# Patient Record
Sex: Male | Born: 1955 | Race: White | Hispanic: No | Marital: Married | Smoking: Former smoker
Health system: Southern US, Community
[De-identification: ages and names within clinical notes are randomized; demographics above are authoritative.]

## PROBLEM LIST (undated history)

## (undated) DIAGNOSIS — I10 Essential (primary) hypertension: Secondary | ICD-10-CM

## (undated) DIAGNOSIS — I480 Paroxysmal atrial fibrillation: Secondary | ICD-10-CM

## (undated) DIAGNOSIS — I639 Cerebral infarction, unspecified: Secondary | ICD-10-CM

## (undated) DIAGNOSIS — N189 Chronic kidney disease, unspecified: Secondary | ICD-10-CM

## (undated) DIAGNOSIS — I2510 Atherosclerotic heart disease of native coronary artery without angina pectoris: Secondary | ICD-10-CM

---

## 2005-12-04 ENCOUNTER — Observation Stay: Payer: Self-pay | Admitting: Cardiology

## 2007-07-02 ENCOUNTER — Ambulatory Visit: Payer: Self-pay | Admitting: Family Medicine

## 2014-02-08 ENCOUNTER — Inpatient Hospital Stay: Payer: Self-pay | Admitting: Internal Medicine

## 2014-02-08 LAB — CBC WITH DIFFERENTIAL/PLATELET
BASOS ABS: 0 10*3/uL (ref 0.0–0.1)
BASOS PCT: 0.6 %
Eosinophil #: 0.1 10*3/uL (ref 0.0–0.7)
Eosinophil %: 2.1 %
HCT: 47.3 % (ref 40.0–52.0)
HGB: 15.3 g/dL (ref 13.0–18.0)
LYMPHS ABS: 0.9 10*3/uL — AB (ref 1.0–3.6)
Lymphocyte %: 14.6 %
MCH: 30.7 pg (ref 26.0–34.0)
MCHC: 32.3 g/dL (ref 32.0–36.0)
MCV: 95 fL (ref 80–100)
Monocyte #: 0.5 x10 3/mm (ref 0.2–1.0)
Monocyte %: 9.2 %
NEUTROS PCT: 73.5 %
Neutrophil #: 4.3 10*3/uL (ref 1.4–6.5)
PLATELETS: 216 10*3/uL (ref 150–440)
RBC: 4.98 10*6/uL (ref 4.40–5.90)
RDW: 13.3 % (ref 11.5–14.5)
WBC: 5.8 10*3/uL (ref 3.8–10.6)

## 2014-02-08 LAB — CK TOTAL AND CKMB (NOT AT ARMC)
CK, TOTAL: 190 U/L
CK-MB: 6 ng/mL — ABNORMAL HIGH (ref 0.5–3.6)

## 2014-02-08 LAB — COMPREHENSIVE METABOLIC PANEL
Albumin: 3.7 g/dL (ref 3.4–5.0)
Alkaline Phosphatase: 71 U/L
Anion Gap: 4 — ABNORMAL LOW (ref 7–16)
BILIRUBIN TOTAL: 0.5 mg/dL (ref 0.2–1.0)
BUN: 20 mg/dL — ABNORMAL HIGH (ref 7–18)
CREATININE: 1.48 mg/dL — AB (ref 0.60–1.30)
Calcium, Total: 8.3 mg/dL — ABNORMAL LOW (ref 8.5–10.1)
Chloride: 109 mmol/L — ABNORMAL HIGH (ref 98–107)
Co2: 28 mmol/L (ref 21–32)
EGFR (African American): >60
EGFR (Non-African Amer.): 52 — ABNORMAL LOW
Glucose: 97 mg/dL (ref 65–99)
OSMOLALITY: 284 (ref 275–301)
Potassium: 3.7 mmol/L (ref 3.5–5.1)
SGOT(AST): 31 U/L (ref 15–37)
SGPT (ALT): 45 U/L
SODIUM: 141 mmol/L (ref 136–145)
TOTAL PROTEIN: 7 g/dL (ref 6.4–8.2)

## 2014-02-08 LAB — TROPONIN I
Troponin-I: <0.02 ng/mL
Troponin-I: <0.02 ng/mL

## 2014-02-08 LAB — PROTIME-INR
INR: 1
PROTHROMBIN TIME: 12.7 s (ref 11.5–14.7)

## 2014-02-08 LAB — TSH: Thyroid Stimulating Horm: 2.02 u[IU]/mL

## 2014-02-08 LAB — APTT: Activated PTT: 25.7 secs (ref 23.6–35.9)

## 2014-02-09 LAB — BASIC METABOLIC PANEL
Anion Gap: 5 — ABNORMAL LOW (ref 7–16)
BUN: 18 mg/dL (ref 7–18)
Calcium, Total: 8 mg/dL — ABNORMAL LOW (ref 8.5–10.1)
Chloride: 110 mmol/L — ABNORMAL HIGH (ref 98–107)
Co2: 27 mmol/L (ref 21–32)
Creatinine: 1.31 mg/dL — ABNORMAL HIGH (ref 0.60–1.30)
EGFR (Non-African Amer.): 60 — ABNORMAL LOW
GLUCOSE: 90 mg/dL (ref 65–99)
OSMOLALITY: 285 (ref 275–301)
Potassium: 4.3 mmol/L (ref 3.5–5.1)
SODIUM: 142 mmol/L (ref 136–145)

## 2014-02-09 LAB — CBC WITH DIFFERENTIAL/PLATELET
BASOS PCT: 0.6 %
Basophil #: 0 10*3/uL (ref 0.0–0.1)
Eosinophil #: 0.1 10*3/uL (ref 0.0–0.7)
Eosinophil %: 2.3 %
HCT: 44.5 % (ref 40.0–52.0)
HGB: 14.6 g/dL (ref 13.0–18.0)
Lymphocyte #: 0.9 10*3/uL — ABNORMAL LOW (ref 1.0–3.6)
Lymphocyte %: 16 %
MCH: 31.2 pg (ref 26.0–34.0)
MCHC: 32.9 g/dL (ref 32.0–36.0)
MCV: 95 fL (ref 80–100)
MONO ABS: 0.7 x10 3/mm (ref 0.2–1.0)
Monocyte %: 12.6 %
NEUTROS PCT: 68.5 %
Neutrophil #: 3.7 10*3/uL (ref 1.4–6.5)
Platelet: 199 10*3/uL (ref 150–440)
RBC: 4.69 10*6/uL (ref 4.40–5.90)
RDW: 13.3 % (ref 11.5–14.5)
WBC: 5.4 10*3/uL (ref 3.8–10.6)

## 2014-02-09 LAB — CK TOTAL AND CKMB (NOT AT ARMC)
CK, Total: 194 U/L
CK-MB: 5.8 ng/mL — AB (ref 0.5–3.6)

## 2014-02-09 LAB — TROPONIN I

## 2014-02-10 DIAGNOSIS — I4891 Unspecified atrial fibrillation: Secondary | ICD-10-CM

## 2014-02-10 LAB — CBC WITH DIFFERENTIAL/PLATELET
BASOS ABS: 0 10*3/uL (ref 0.0–0.1)
BASOS PCT: 0.8 %
EOS ABS: 0.1 10*3/uL (ref 0.0–0.7)
EOS PCT: 2.1 %
HCT: 40.6 % (ref 40.0–52.0)
HGB: 12.8 g/dL — ABNORMAL LOW (ref 13.0–18.0)
Lymphocyte #: 1 10*3/uL (ref 1.0–3.6)
Lymphocyte %: 18.7 %
MCH: 30.4 pg (ref 26.0–34.0)
MCHC: 31.6 g/dL — AB (ref 32.0–36.0)
MCV: 96 fL (ref 80–100)
Monocyte #: 0.6 x10 3/mm (ref 0.2–1.0)
Monocyte %: 12.2 %
Neutrophil #: 3.5 10*3/uL (ref 1.4–6.5)
Neutrophil %: 66.2 %
Platelet: 174 10*3/uL (ref 150–440)
RBC: 4.22 10*6/uL — ABNORMAL LOW (ref 4.40–5.90)
RDW: 13.3 % (ref 11.5–14.5)
WBC: 5.3 10*3/uL (ref 3.8–10.6)

## 2014-02-10 LAB — BASIC METABOLIC PANEL
Anion Gap: 4 — ABNORMAL LOW (ref 7–16)
BUN: 16 mg/dL (ref 7–18)
CALCIUM: 8.2 mg/dL — AB (ref 8.5–10.1)
CHLORIDE: 107 mmol/L (ref 98–107)
CO2: 28 mmol/L (ref 21–32)
Creatinine: 1.19 mg/dL (ref 0.60–1.30)
EGFR (African American): >60
EGFR (Non-African Amer.): >60
GLUCOSE: 83 mg/dL (ref 65–99)
Osmolality: 278 (ref 275–301)
POTASSIUM: 4.3 mmol/L (ref 3.5–5.1)
SODIUM: 139 mmol/L (ref 136–145)

## 2014-02-10 LAB — CK-MB: CK-MB: 2.3 ng/mL (ref 0.5–3.6)
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
## 2014-08-22 NOTE — Consult Note (Signed)
Chief Complaint:  Subjective/Chief Complaint States to feel reasonably well no further episodes of weakness or numbness no palpitations or tachycardia. the patient feels like she is totally back to normal   VITAL SIGNS/ANCILLARY NOTES: **Vital Signs.:   13-Oct-15 11:48  Vital Signs Type Routine  Temperature Temperature (F) 97.9  Celsius 36.6  Temperature Source oral  Pulse Pulse 59  Respirations Respirations 18  Systolic BP Systolic BP 627  Diastolic BP (mmHg) Diastolic BP (mmHg) 71  Mean BP 85  Pulse Ox % Pulse Ox % 95  Pulse Ox Activity Level  At rest  Oxygen Delivery Room Air/ 21 %  *Intake and Output.:   13-Oct-15 11:15  Oral Intake      In:  240  Percentage of Meal Eaten  100   Brief Assessment:  GEN well developed, well nourished, no acute distress   Cardiac Regular  no murmur   Respiratory normal resp effort  clear BS   Gastrointestinal Normal   Gastrointestinal details normal Soft  Nontender   EXTR negative cyanosis/clubbing, negative edema   Lab Results: Thyroid:  11-Oct-15 15:34   Thyroid Stimulating Hormone 2.02 (0.45-4.50 (IU = International Unit)  ----------------------- Pregnant patients have  different reference  ranges for TSH:  - - - - - - - - - -  Pregnant, first trimetser:  0.36 - 2.50 uIU/mL)  LabObservation:  12-Oct-15 09:00   OBSERVATION Reason for Test  Hepatic:  11-Oct-15 15:34   Bilirubin, Total 0.5  Alkaline Phosphatase 71 (46-116 NOTE: New Reference Range 11/18/13)  SGPT (ALT) 45 (14-63 NOTE: New Reference Range 11/18/13)  SGOT (AST) 31  Total Protein, Serum 7.0  Albumin, Serum 3.7  Cardiology:  12-Oct-15 09:00   Echo Doppler REASON FOR EXAM:     COMMENTS:     PROCEDURE: [facility] - ECHO DOPPLER COMPLETE(TRANSTHOR)  - Feb 09 2014  9:00AM   RESULT: Echocardiogram Report  Patient Name:   Eric Bryant Date of Exam: 02/09/2014 Medical Rec #:  [id number]         Custom1: Dateof Birth:  15-Mar-1956      Height:       72.0  in Patient Age:    59 years       Weight:       179.0 lb Patient Gender: M              BSA:          2.03 m??  Indications: TIA Sonographer:    Sherrie Sport RDCS Referring Phys: Fulton Reek, D  Summary:  1. Left ventricular ejection fraction, by visual estimation, is 65 to  70%.  2. Normal global left ventricular systolic function.  3. Mild mitral valve regurgitation. 2D AND M-MODE MEASUREMENTS (normal ranges within parentheses): Left Ventricle:          Normal IVSd (2D):      1.35 cm (0.7-1.1) LVPWd (2D):     1.02 cm (0.7-1.1) Aorta/LA:                  Normal LVIDd (2D):     4.22 cm (3.4-5.7) Aortic Root (2D): 2.70 cm (2.4-3.7) LVIDs (2D):     2.59 cm           Left Atrium (2D): 3.80 cm (1.9-4.0) LV FS (2D):     38.6 %   (>25%) LV EF (2D):     69.3 %   (>50%)  Right Ventricle:                                   RVd (2D):        3.41 cm LV DIASTOLIC FUNCTION: MV Peak E: 0.89 m/s E/e' Ratio: 8.20 MV Peak A: 0.57 m/s Decel Time: 153 msec E/A Ratio: 1.56 SPECTRAL DOPPLER ANALYSIS (where applicable): Mitral Valve: MV P1/2 Time: 44.37 msec MV Area, PHT: 4.96 cm?? Aortic Valve: AoV Max Vel: 1.02 m/s AoV Peak PG: 4.2 mmHg AoV Mean PG: LVOT Vmax: 0.65 m/s LVOT VTI:  LVOT Diameter: 2.00 cm AoV Area, Vmax: 2.01 cm?? AoV Area, VTI:  AoV Area, Vmn: Tricuspid Valve and PA/RV Systolic Pressure: TR Max Velocity: 1.58 m/s RA  Pressure: 5 mmHg RVSP/PASP: 14.9 mmHg Pulmonic Valve: PV Max Velocity: 0.71 m/s PV Max PG: 2.0 mmHg PV Mean PG:  PHYSICIAN INTERPRETATION: Left Ventricle: The left ventricular internal cavity size was normal. LV  septal wall thickness was normal. LV posterior wall thickness was normal.  Global LV systolic function was normal. Left ventricular ejection  fraction, by visual estimation, is 65 to 70%. Right Ventricle: The right ventricular size is normal. Global RV systolic  function is normal. Left Atrium: The left atrium is normal  in size. Right Atrium: The right atrium is normal in size. Pericardium: There is no evidence of pericardial effusion. Mitral Valve: The mitral valve is normal in structure. Mild mitral valve  regurgitation is seen. Tricuspid Valve: The tricuspid valve is normal.Trivial tricuspid  regurgitation is visualized. The tricuspid regurgitant velocity is 1.58  m/s, and with an assumed right atrial pressure of 5 mmHg, the estimated  right ventricular systolic pressure is normal at 14.9 mmHg. Aortic Valve: The aortic valve is normal. No evidence of aortic valve  regurgitation is seen. Pulmonic Valve: The pulmonic valve is normal.  [place] MD Electronically signed by Jenkins MD Signature Date/Time: 02/09/2014/12:05:58 PM  *** Final ***  IMPRESSION: .  Verified By: Yolonda Kida, M.D., MD  Routine Chem:  11-Oct-15 15:34   Glucose, Serum 97  BUN  20  Creatinine (comp)  1.48  Sodium, Serum 141  Potassium, Serum 3.7  Chloride, Serum  109  CO2, Serum 28  Calcium (Total), Serum  8.3  Anion Gap  4  Osmolality (calc) 284  eGFR (African American) >60  eGFR (Non-African American)  52 (eGFR values <28m/min/1.73 m2 may be an indication of chronic kidney disease (CKD). Calculated eGFR, using the MRDR Study equation, is useful in  patients with stable renal function. The eGFR calculation will not be reliable in acutely ill patients when serum creatinine is changing rapidly. It is not useful in patients on dialysis. The eGFR calculation may not be applicable to patients at the low and high extremes of body sizes, pregnant women, and vetetarians.)  12-Oct-15 05:48   Glucose, Serum 90  BUN 18  Creatinine (comp)  1.31  Sodium, Serum 142  Potassium, Serum 4.3  Chloride, Serum  110  CO2, Serum 27  Calcium (Total), Serum  8.0  Anion Gap  5  Osmolality (calc) 285  eGFR (African American) >60  eGFR (Non-African American)  60 (eGFR values <683mmin/1.73 m2 may be  an indication of chronic kidney disease (CKD). Calculated eGFR, using the MRDR Study equation, is useful in  patients with stable renal function. The eGFR calculation will not be reliable in acutely ill patients when serum creatinine  is changing rapidly. It is not useful in patients on dialysis. The eGFR calculation may not be applicable to patients at the low and high extremes of body sizes, pregnant women, and vetetarians.)  Cardiac:  11-Oct-15 15:34   Troponin I < 0.02 (0.00-0.05 0.05 ng/mL or less: NEGATIVE  Repeat testing in 3-6 hrs  if clinically indicated. >0.05 ng/mL: POTENTIAL  MYOCARDIAL INJURY. Repeat  testing in 3-6 hrs if  clinically indicated. NOTE: An increase or decrease  of 30% or more on serial  testing suggests a  clinically important change)  Routine Coag:  11-Oct-15 15:34   Prothrombin 12.7  INR 1.0 (INR reference interval applies to patients on anticoagulant therapy. A single INR therapeutic range for coumarins is not optimal for all indications; however, the suggested range for most indications is 2.0 - 3.0. Exceptions to the INR Reference Range may include: Prosthetic heart valves, acute myocardial infarction, prevention of myocardial infarction, and combinations of aspirin and anticoagulant. The need for a higher or lower target INR must be assessed individually. Reference: The Pharmacology and Management of the Vitamin K  antagonists: the seventh ACCP Conference on Antithrombotic and Thrombolytic Therapy. WVPXT.0626 Sept:126 (3suppl): [id number]. A HCT value >55% may artifactually increase the PT.  In one study,  the increase was an average of 25%. Reference:  "Effect on Routine and Special Coagulation Testing Values of Citrate Anticoagulant Adjustment in Patients with High HCT Values." American Journal of Clinical Pathology 2006;126:400-405.)  Activated PTT (APTT) 25.7 (A HCT value >55% may artifactually increase the APTT. In one study, the  increase was an average of 19%. Reference: "Effect on Routine and Special Coagulation Testing Values of Citrate Anticoagulant Adjustment in Patients with High HCT Values." American Journal of Clinical Pathology 2006;126:400-405.)  Routine Hem:  11-Oct-15 15:34   WBC (CBC) 5.8  RBC (CBC) 4.98  Hemoglobin (CBC) 15.3  Hematocrit (CBC) 47.3  Platelet Count (CBC) 216  MCV 95  MCH 30.7  MCHC 32.3  RDW 13.3  Neutrophil % 73.5  Lymphocyte % 14.6  Monocyte % 9.2  Eosinophil % 2.1  Basophil % 0.6  Neutrophil # 4.3  Lymphocyte #  0.9  Monocyte # 0.5  Eosinophil # 0.1  Basophil # 0.0 (Result(s) reported on 08 Feb 2014 at 03:47PM.)  12-Oct-15 05:48   WBC (CBC) 5.4  RBC (CBC) 4.69  Hemoglobin (CBC) 14.6  Hematocrit (CBC) 44.5  Platelet Count (CBC) 199  MCV 95  MCH 31.2  MCHC 32.9  RDW 13.3  Neutrophil % 68.5  Lymphocyte % 16.0  Monocyte % 12.6  Eosinophil % 2.3  Basophil % 0.6  Neutrophil # 3.7  Lymphocyte #  0.9  Monocyte # 0.7  Eosinophil # 0.1  Basophil # 0.0 (Result(s) reported on 09 Feb 2014 at 06:17AM.)   Radiology Results: [place]:    11-Oct-15 17:53, US Carotid Doppler Bilateral  US Carotid Doppler Bilateral   REASON FOR EXAM:    TIA  COMMENTS:       PROCEDURE: [place]  - US CAROTID DOPPLER BILATERAL  - Feb 08 2014  5:53PM     CLINICAL DATA:  TIA, syncopal episode, visual disturbance. Initial  encounter.    EXAM:  BILATERAL CAROTID DUPLEX ULTRASOUND    TECHNIQUE:  Pearline Cables scale imaging, color Doppler and duplex ultrasound were  performed of bilateral carotid and vertebral arteries in the neck.  COMPARISON:  Head CT -02/08/2014    FINDINGS:  Criteria: Quantification of carotid stenosis is based on velocity  parameters that correlate the  residual internal carotid diameter  with NASCET-based stenosis levels, using the diameter of the distal  internal carotid lumen as the denominator for stenosis measurement.    The following velocity measurements were  obtained:    RIGHT    ICA:  91/37.9 cm/sec    CCA:  29.9/37.1 cm/sec  SYSTOLIC ICA/CCA RATIO:  6.96    DIASTOLIC ICA/CCA RATIO:  1.6    ECA:  90.4 cm/sec    LEFT    ICA:  97.2/44.5 cm/sec    CCA:  789.3/81.0 cm/sec    SYSTOLIC ICA/CCA RATIO:  1.75    DIASTOLIC ICA/CCA RATIO:  1.6  ECA:  119.3 cm/sec    RIGHT CAROTID ARTERY: There is a minimal amount of eccentric mixed  echogenic plaque involving the origin and proximal aspects of the  right internal carotid artery (image 17), not resulting in elevated  peak systolic velocities within the interrogated course to the right  internal carotid artery to suggest a hemodynamically significant  stenosis.    RIGHT VERTEBRAL ARTERY:  Antegrade flow    LEFT CAROTID ARTERY: There is a minimal amount of eccentric mixed  echogenic plaque within the left carotid bulb (images 48 and 62),  not resulting in elevated peak systolic velocities within the  interrogated course of the left internal carotid artery to suggest a  hemodynamically significant stenosis.    LEFT VERTEBRAL ARTERY:  Antegrade flow     IMPRESSION:  Minimal amount of bilateral atherosclerotic plaque, right  subjectively greater than left, not resulting in a hemodynamically  significant stenosis.      Electronically Signed    By: Sandi Mariscal M.D.    On: 02/08/2014 18:10       Verified By: Aileen Fass, M.D.,  MRI:    12-Oct-15 09:06, MRI Brain Without Contrast  MRI Brain Without Contrast   REASON FOR EXAM:    TIA  COMMENTS:       PROCEDURE: MR  - MR BRAIN WO CONTRAST  - Feb 09 2014  9:06AM     CLINICAL DATA:  Initial encounter for 5 min episode yesterday which  includes slurred speech, numbness to the right side of the face,  presyncopal sensation, and ringing in the ears.    EXAM:  MRI HEAD WITHOUT CONTRAST    TECHNIQUE:  Multiplanar, multiecho pulse sequences of the brain and surrounding  structures were obtained without intravenous  contrast.  COMPARISON:  CT head without contrast 02/08/2014.    FINDINGS:  A 4 mm focus of restricted diffusion is evident within the posterior  left frontal lobe, potentially presumably along the primary motor  cortex. There is associated T2 signal change. No hemorrhage is  evident.    No other focal areas of restricted diffusion is present. There is no  significant white matter disease or evidence for prior ischemia.    The ventricles are of normal size. No significant extra-axial fluid  collection is present.    Flow is present in the majorintracranial arteries. The globes and  orbits are intact. Mild mucosal thickening is present in the  maxillary sinuses and ethmoid air cells bilaterally. The mastoid air  cells are clear.     IMPRESSION:  1. 4 mm area of acute/subacute nonhemorrhagicinfarction involving  the posterior left frontal lobe, presumably along the primary motor  cortex.  2. No other acute infarct or evidence for significant ischemia,  acute or chronic.  3. Mild diffuse sinus disease.      Electronically Signed  By: Lawrence Santiago M.D.    On: 02/09/2014 09:18         Verified By: Resa Miner. MATTERN, M.D.,  Cardiology:    11-Oct-15 15:29, ED ECG  Ventricular Rate 129  Atrial Rate 115  QRS Duration 92  QT 310  QTc 454  R Axis 56  T Axis 66  ECG interpretation   Atrial fibrillation with rapid ventricular response  Abnormal ECG  No previous ECGs available  ----------unconfirmed----------  Confirmed by OVERREAD, NOT (100), editor PEARSON, BARBARA (32) on 02/09/2014 12:45:18 PM  ED ECG     11-Oct-15 16:44, ED ECG  Ventricular Rate 71  Atrial Rate 71  P-R Interval 154  QRS Duration 92  QT 368  QTc 399  P Axis 63  R Axis 36  T Axis 49  ECG interpretation   Sinus rhythm with Premature supraventricular complexes  Left atrial enlargement  Borderline ECG  No previous ECGs available  ----------unconfirmed----------  Confirmed by OVERREAD, NOT  (100), editor PEARSON, BARBARA (32) on 02/09/2014 12:45:32 PM  ED ECG     12-Oct-15 09:00, Echo Doppler  Echo Doppler   REASON FOR EXAM:      COMMENTS:       PROCEDURE: [place] - ECHO DOPPLER COMPLETE(TRANSTHOR)  - Feb 09 2014  9:00AM     RESULT: Echocardiogram Report    Patient Name:   Eric Bryant Date of Exam: 02/09/2014  Medical Rec #:  [id number]         Custom1:  Dateof Birth:  13-Nov-1955      Height:       72.0 in  Patient Age:    59 years       Weight:       179.0 lb  Patient Gender: M              BSA:          2.03 m??    Indications: TIA  Sonographer:    Sherrie Sport RDCS  Referring Phys: Fulton Reek, D    Summary:   1. Left ventricular ejection fraction, by visual estimation, is 65 to   70%.   2. Normal global left ventricular systolic function.   3. Mild mitral valve regurgitation.  2D AND M-MODE MEASUREMENTS (normal ranges within parentheses):  Left Ventricle:          Normal  IVSd (2D):      1.35 cm (0.7-1.1)  LVPWd (2D):     1.02 cm (0.7-1.1) Aorta/LA:                  Normal  LVIDd (2D):     4.22 cm (3.4-5.7) Aortic Root (2D): 2.70 cm (2.4-3.7)  LVIDs (2D):     2.59 cm           Left Atrium (2D): 3.80 cm (1.9-4.0)  LV FS (2D):     38.6 %   (>25%)  LV EF (2D):     69.3 %   (>50%)                                    Right Ventricle:                                    RVd (2D):        2.46 cm  LV  DIASTOLIC FUNCTION:  MV Peak E: 0.89 m/s E/e' Ratio: 8.20  MV Peak A: 0.57 m/s Decel Time: 153 msec  E/A Ratio: 1.56  SPECTRAL DOPPLER ANALYSIS (where applicable):  Mitral Valve:  MV P1/2 Time: 44.37 msec  MV Area, PHT: 4.96 cm??  Aortic Valve: AoV Max Vel: 1.02 m/s AoV Peak PG: 4.2 mmHg AoV Mean PG:  LVOT Vmax: 0.65 m/s LVOT VTI:  LVOT Diameter: 2.00 cm  AoV Area, Vmax: 2.01 cm?? AoV Area, VTI:  AoV Area, Vmn:  Tricuspid Valve and PA/RV Systolic Pressure: TR Max Velocity: 1.58 m/s RA   Pressure: 5 mmHg RVSP/PASP: 14.9 mmHg  Pulmonic Valve:  PV Max Velocity: 0.71 m/s  PV Max PG: 2.0 mmHg PV Mean PG:    PHYSICIAN INTERPRETATION:  Left Ventricle: The left ventricular internal cavity size was normal. LV   septal wall thickness was normal. LV posterior wall thickness was normal.   Global LV systolic function was normal. Left ventricular ejection   fraction, by visual estimation, is 65 to 70%.  Right Ventricle: The right ventricular size is normal. Global RV systolic   function is normal.  Left Atrium: The left atrium is normal in size.  Right Atrium: The right atrium is normal in size.  Pericardium: There is no evidence of pericardial effusion.  Mitral Valve: The mitral valve is normal in structure. Mild mitral valve   regurgitation is seen.  Tricuspid Valve: The tricuspid valve is normal.Trivial tricuspid   regurgitation is visualized. The tricuspid regurgitant velocity is 1.58   m/s, and with an assumed right atrial pressure of 5 mmHg, the estimated   right ventricular systolic pressure is normal at 14.9 mmHg.  Aortic Valve: The aortic valve is normal. No evidence of aortic valve   regurgitation is seen.  Pulmonic Valve: The pulmonic valve is normal.    [place] MD  Electronically signed by [place] MD  Signature Date/Time: 02/09/2014/12:05:58 PM    *** Final ***    IMPRESSION: .    Verified By: Yolonda Kida, M.D., MD  CT:    11-Oct-15 15:52, CT Head Without Contrast  CT Head Without Contrast   REASON FOR EXAM:    Slurred speech, facial numbness  COMMENTS:       PROCEDURE: CT  - CT HEAD WITHOUT CONTRAST  - Feb 08 2014  3:52PM     CLINICAL DATA:  Slurred speech, right face numbness for 1 hr, right  face tingling    EXAM:  CT HEAD WITHOUT CONTRAST    TECHNIQUE:  Contiguous axial images were obtained from the base of the skull  through the vertex without intravenous contrast.  COMPARISON:  None.    FINDINGS:  No skull fracture is noted. Paranasal sinuses and mastoid air cells  are unremarkable. No  intracranial hemorrhage, mass effect or midline  shift.    No acute cortical infarction. No mass lesion is noted on this  unenhanced scan. No hydrocephalus.     IMPRESSION:  No acute intracranial abnormality. No definite acute cortical  infarction.    Electronically Signed    By: Lahoma Crocker M.D.    On: 02/08/2014 15:57         Verified By: Ephraim Hamburger, M.D.,   Assessment/Plan:  Assessment/Plan:  Assessment IMP  CVA  TIA  atrial fibrillation  chronic renal insufficiency  hypertension  possible obstructive sleep apnea .   Plan PLAN  continue telemetry today  continue aspirin 81 mg a day  will start anticoagulation with Eliquis 5 mg twice a day for AFib  discontinue  metoprolol  start sotalol 80 mg twice a day 1st dose was last night      will try given additional 2 with 3 dosages today  blood pressure control diet exercise and hopefully with low but of sotalol  follow-up renal insufficiency consider evaluation by Nephrology  recommend obstructive sleep apnea evaluation including sleep study and or CPAP  hopefully patient maybe baby discharged today after watching him on the monitor   check EKG today prior to discharge for evaluation of QT  patient should follow-up with Dr. Ubaldo Glassing within 1 week   Electronic Signatures: Lujean Amel D (MD)  (Signed 13-Oct-15 15:18)  Authored: Chief Complaint, VITAL SIGNS/ANCILLARY NOTES, Brief Assessment, Lab Results, Radiology Results, Assessment/Plan   Last Updated: 13-Oct-15 15:18 by Yolonda Kida (MD)

## 2014-08-22 NOTE — Discharge Summary (Signed)
PATIENT NAME:  Eric Bryant, Eric Bryant MR#:  [id number] DATE OF BIRTH:  October 03, 1955  DATE OF ADMISSION:  02/08/2014 DATE OF DISCHARGE:  02/10/2014  DISCHARGE DIAGNOSES:  1.  Cerebrovascular accident, 4 mm nonhemorrhagic infarction involving posterior left frontal lobe along the primary motor cortex.  2.  Paroxysmal atrial fibrillation, now on Eliquis.  3.  Hypertension.  4.  Acute kidney injury, resolved.   CONSULTATIONS: Lujean Amel, MD, cardiology.   PROCEDURES:  1.  CT of the head without contrast October 11 shows no acute intracranial abnormality. No definite acute cortical infarction.  2.  Bilateral carotid Dopplers show minimal amount of bilateral atherosclerotic plaque, right subjectively greater than left, not resulting in hemodynamically significant stenoses.  3.  MRI of the brain without contrast shows a 4 mm area of acute/subacute nonhemorrhagic infarction involving the posterior left frontal lobe, presumably along the primary motor cortex. No other acute infarct or evidence for significant ischemia. Mild diffuse sinus disease.  4.  A 2D echocardiogram October 12 shows left ventricular ejection fraction is 65% to 70%. Normal global left ventricular systolic function. Mild mitral valve regurgitation.   HISTORY OF PRESENT ILLNESS: This very pleasant 59 year old male with history of transient atrial fibrillation, on beta blocker therapy, followed by Dr. Ubaldo Glassing presents with abrupt onset of right-sided facial weakness. The patient reports that he was driving when he noted a sensation of numbness over the right side of the face with blurred vision in the right eye. He also noticed an expressive aphasia when he turned his wife to tell her something was wrong. Symptoms resolved completely after 15 minutes. He was brought to the Emergency Room for evaluation and was noted to be in rapid atrial fibrillation, which spontaneously converted to normal sinus rhythm.   HOSPITAL COURSE BY PROBLEM:  1.   Cerebrovascular accident: Symptoms resolved completely within 15 minutes with no residual defect. MRI does show new 4 mm nonhemorrhagic infarct. Two-D echocardiogram and carotid Dopplers are normal. He was started on Eliquis and aspirin for secondary prevention of stroke.  2.  Atrial fibrillation: He was started on sotalol for rate control as well as Eliquis during this hospitalization. He was seen by Dr. Marcelline Deist. He was discharged after receiving 3 doses of sotalol and followup EKG was normal.  3.  Hypertension: Metoprolol was discontinued. Blood pressure was controlled on sotalol.  4.  Acute kidney injury: Resolved with hydration.   DISCHARGE PHYSICAL EXAMINATION:  VITAL SIGNS: Temperature 97.8, pulse 56, respirations 18, blood pressure 117/71, oxygenation 97% on room air.  GENERAL: No acute distress.  CARDIOVASCULAR: Regular rate and rhythm; no murmurs, rubs or gallops; no peripheral edema. Peripheral pulses are 2+.  RESPIRATORY: Lungs are clear to auscultation bilaterally with good air movement.   LABORATORY DATA: Sodium 139, potassium 4.3, chloride 107, bicarb 28, BUN 16, creatinine 1.19. Cardiac enzymes negative throughout hospitalization with the exception of CK-MB which was initially 6.0, but decreased to 2.3. TSH 2.02. White blood cells 5.3, hemoglobin 12.8, platelets 174,000, MCV 96.   CONDITION ON DISCHARGE: Stable.   DISPOSITION: The patient is discharged to home.   DISCHARGE MEDICATIONS:  1.  Aspirin 81 mg 1 tablet daily.  2.  Centrum Silver Mens multivitamin 1 tablet once daily.  3.  Fish oil 360 mg orally once a day.  4.  Betapace 80 mg 1 tablet twice a day.  5.  Eliquis 5 mg 1 tablet 2 times a day.   DISCHARGE INSTRUCTIONS:  DIET: Low-fat, low-cholesterol diet.   ACTIVITY:  No restrictions.   FOLLOWUP:  1.  In 1 to 2 weeks with Dr. Ubaldo Glassing.  2.  Follow up in 1 to 2 weeks with primary care   TIME SPENT ON DISCHARGE: Forty-five  minutes.   ____________________________ Earleen Newport. Volanda Napoleon, MD cpw:TT D: 02/11/2014 16:32:50 ET T: 02/11/2014 20:51:10 ET JOB#: [id number]  cc: Barnetta Chapel P. Volanda Napoleon, MD, <Dictator> Aldean Jewett MD ELECTRONICALLY SIGNED 02/12/2014 15:06

## 2014-08-22 NOTE — H&P (Signed)
PATIENT NAME:  Eric Bryant, Eric Bryant MR#:  [id number] DATE OF BIRTH:  Feb 16, 1956  REFERRING PHYSICIAN: Elta Guadeloupe R. Jacqualine Code, MD  FAMILY PHYSICIAN: None.   REASON FOR ADMISSION: Transient ischemic attack.   HISTORY OF PRESENT ILLNESS: The patient is a 59 year old male with a history of transient atrial fibrillation on beta blocker therapy followed by Dr. Ubaldo Glassing. The patient denies any other significant past medical history. Today, the patient was driving when he developed right-sided facial weakness with blurred vision and numbness of the right face. Also noted to have expressive aphasia. Was brought to the Emergency Room where he was initially noted to be in rapid atrial fibrillation. He spontaneously converted to a normal sinus rhythm. His neurologic symptoms resolved after 15 minutes. He is now admitted for further evaluation.   PAST MEDICAL HISTORY: 1.  Paroxysmal atrial fibrillation.  2.  Benign hypertension.   MEDICATIONS:  Toprol-XL 25 mg p.o. daily.  ' ALLERGIES: PENICILLIN.   SOCIAL HISTORY: Negative for alcohol or tobacco use.   FAMILY HISTORY: Positive for coronary artery disease and hypertension.   REVIEW OF SYSTEMS:  CONSTITUTIONAL: No fever or change in weight.  EYES: Blurred vision as per HPI. No glaucoma.  EARS, NOSE AND THROAT: Did have some right-sided tinnitus. No difficulty swallowing. RESPIRATORY: No cough or wheezing. Denies hemoptysis.  CARDIOVASCULAR: No chest pain or palpitations. No syncope.  GASTROINTESTINAL: No nausea, vomiting, or diarrhea. No abdominal pain.  GENITOURINARY: No dysuria or hematuria. No incontinence.   ENDOCRINE: No polyuria or polydipsia. No heat or cold intolerance.  HEMATOLOGIC: The patient denies anemia, easy bruising, or bleeding.  LYMPHATIC: No swollen glands.  MUSCULOSKELETAL: The patient denies pain in his neck, back, shoulders, knees, or hips. No gout.  NEUROLOGIC: The patient did have numbness of the right face which has resolved. Denies  migraines, stroke, or seizures.  PSYCHIATRIC: The patient denies anxiety, insomnia, or depression.   PHYSICAL EXAMINATION: GENERAL: The patient is currently in no acute distress.  VITAL SIGNS: Remarkable for a blood pressure of 155/95 with a heart rate of 72, respiratory rate of 16, temperature of 98.1.  HEENT: Normocephalic, atraumatic. Pupils equally round and react to light and accommodation. Extraocular movements are intact. Sclerae not icteric. Conjunctivae are clear.  Oropharynx is clear. NECK: Supple without JVD or bruits. No adenopathy or thyromegaly is noted.  LUNGS: Clear to auscultation and percussion without wheezes, rales, or rhonchi. No dullness. Respiratory effort is normal.  CARDIAC: Regular rate and rhythm with normal S1, S2. No significant rubs, murmurs, or gallops. PMI is nondisplaced. Chest wall is nontender.  ABDOMEN: Soft, nontender, with normoactive bowel sounds. No organomegaly or masses were appreciated. No hernias or bruits were noted.  EXTREMITIES: Without clubbing, cyanosis, or edema. Pulses were 2+ bilaterally.  SKIN: Warm and dry without rash or lesions.  NEUROLOGIC: Revealed cranial nerves II through XII grossly intact. Deep tendon reflexes were symmetric. Motor and sensory exam is nonfocal.  PSYCHIATRIC: Revealed the patient was alert and oriented to person, place, and time. He was cooperative and used good judgment.   LABORATORY DATA: Initial EKG revealed rapid atrial fibrillation with no acute ischemic changes. Subsequent EKG revealed sinus rhythm with no acute ischemic changes. Head CT was unremarkable. His white count was 5.8 with a hemoglobin of 15.3. Glucose 97 with a BUN of 20, creatinine 1.48 and a GFR of 52. Troponin was less than 0.02. TSH was normal at 2.02.   ASSESSMENT: 1.  Transient ischemic attack manifested by expressive aphasia and facial numbness.  2.  Benign hypertension.  3.  Transient atrial fibrillation with rapid ventricular response,  resolved.  4.  Renal insufficiency.   PLAN: The patient will be observed on telemetry with aspirin and Lovenox. Will begin Lopressor 25 mg p.o. q. 6 hours. We will follow serial cardiac enzymes and obtain an echocardiogram. We will also consult cardiology. Because of his expressive aphasia and facial numbness, we will obtain an MRI of the brain and carotid Dopplers. Follow up routine labs in the morning. Neurologic checks q. 4 hours. Further treatment and evaluation will depend upon the patient's progress.   Total time spent on this patient was 45 minutes.    ____________________________ Leonie Douglas Doy Hutching, MD jds:LT D: 02/08/2014 17:21:00 ET T: 02/08/2014 20:25:48 ET JOB#: [id number]  cc: Leonie Douglas. Doy Hutching, MD, <Dictator> Walter Min Lennice Sites MD ELECTRONICALLY SIGNED 02/09/2014 8:01

## 2014-08-22 NOTE — Consult Note (Signed)
Brief Consult Note: Diagnosis: CVA/AFIB.   Patient was seen by consultant.   Consult note dictated.   Recommend further assessment or treatment.   Orders entered.   Discussed with Attending MD.   Comments: IMP CVA AFIB paraxysmal HTN OSA? Marland Kitchen PLAN Tele ASA 81 daily Start sotolol 80 mg po bid D/C metoprolol for now Anticoug long term with Eliquis 5mg  bid Sleep study for eval of OSA Lipids studies for possible ascvd OK to d/c in 24-48 hr after 3 doses of sotolol on tele F/U with Fath 1-2 weeks RTW 1 week.  Electronic Signatures: Lujean Amel D (MD)  (Signed 13-Oct-15 07:08)  Authored: Brief Consult Note   Last Updated: 13-Oct-15 07:08 by Yolonda Kida (MD)

## 2016-06-21 DIAGNOSIS — I10 Essential (primary) hypertension: Secondary | ICD-10-CM | POA: Diagnosis not present

## 2016-06-21 DIAGNOSIS — Z0000 Encounter for general adult medical examination without abnormal findings: Secondary | ICD-10-CM | POA: Diagnosis not present

## 2016-06-21 DIAGNOSIS — Z8673 Personal history of transient ischemic attack (TIA), and cerebral infarction without residual deficits: Secondary | ICD-10-CM | POA: Diagnosis not present

## 2016-07-04 DIAGNOSIS — L570 Actinic keratosis: Secondary | ICD-10-CM | POA: Diagnosis not present

## 2016-07-04 DIAGNOSIS — D225 Melanocytic nevi of trunk: Secondary | ICD-10-CM | POA: Diagnosis not present

## 2016-07-24 DIAGNOSIS — I10 Essential (primary) hypertension: Secondary | ICD-10-CM | POA: Diagnosis not present

## 2016-07-24 DIAGNOSIS — I480 Paroxysmal atrial fibrillation: Secondary | ICD-10-CM | POA: Diagnosis not present

## 2016-07-25 DIAGNOSIS — M1711 Unilateral primary osteoarthritis, right knee: Secondary | ICD-10-CM | POA: Diagnosis not present

## 2017-07-02 DIAGNOSIS — I480 Paroxysmal atrial fibrillation: Secondary | ICD-10-CM | POA: Diagnosis not present

## 2017-07-02 DIAGNOSIS — I4891 Unspecified atrial fibrillation: Secondary | ICD-10-CM | POA: Diagnosis not present

## 2017-07-02 DIAGNOSIS — I10 Essential (primary) hypertension: Secondary | ICD-10-CM | POA: Diagnosis not present

## 2017-07-03 DIAGNOSIS — L570 Actinic keratosis: Secondary | ICD-10-CM | POA: Diagnosis not present

## 2017-07-03 DIAGNOSIS — D225 Melanocytic nevi of trunk: Secondary | ICD-10-CM | POA: Diagnosis not present

## 2018-06-04 DIAGNOSIS — D2261 Melanocytic nevi of right upper limb, including shoulder: Secondary | ICD-10-CM | POA: Diagnosis not present

## 2018-06-04 DIAGNOSIS — L570 Actinic keratosis: Secondary | ICD-10-CM | POA: Diagnosis not present

## 2018-06-04 DIAGNOSIS — D225 Melanocytic nevi of trunk: Secondary | ICD-10-CM | POA: Diagnosis not present

## 2018-06-04 DIAGNOSIS — D2262 Melanocytic nevi of left upper limb, including shoulder: Secondary | ICD-10-CM | POA: Diagnosis not present

## 2018-06-04 DIAGNOSIS — X32XXXA Exposure to sunlight, initial encounter: Secondary | ICD-10-CM | POA: Diagnosis not present

## 2018-07-04 DIAGNOSIS — I480 Paroxysmal atrial fibrillation: Secondary | ICD-10-CM | POA: Diagnosis not present

## 2018-07-04 DIAGNOSIS — Z8673 Personal history of transient ischemic attack (TIA), and cerebral infarction without residual deficits: Secondary | ICD-10-CM | POA: Diagnosis not present

## 2018-07-04 DIAGNOSIS — I10 Essential (primary) hypertension: Secondary | ICD-10-CM | POA: Diagnosis not present

## 2019-09-23 DIAGNOSIS — Z8673 Personal history of transient ischemic attack (TIA), and cerebral infarction without residual deficits: Secondary | ICD-10-CM | POA: Diagnosis not present

## 2019-09-23 DIAGNOSIS — Z125 Encounter for screening for malignant neoplasm of prostate: Secondary | ICD-10-CM | POA: Diagnosis not present

## 2019-09-23 DIAGNOSIS — I480 Paroxysmal atrial fibrillation: Secondary | ICD-10-CM | POA: Diagnosis not present

## 2019-09-23 DIAGNOSIS — I10 Essential (primary) hypertension: Secondary | ICD-10-CM | POA: Diagnosis not present

## 2019-09-30 DIAGNOSIS — K649 Unspecified hemorrhoids: Secondary | ICD-10-CM | POA: Diagnosis not present

## 2019-09-30 DIAGNOSIS — I10 Essential (primary) hypertension: Secondary | ICD-10-CM | POA: Diagnosis not present

## 2019-09-30 DIAGNOSIS — Z8673 Personal history of transient ischemic attack (TIA), and cerebral infarction without residual deficits: Secondary | ICD-10-CM | POA: Diagnosis not present

## 2019-09-30 DIAGNOSIS — I480 Paroxysmal atrial fibrillation: Secondary | ICD-10-CM | POA: Diagnosis not present

## 2020-03-31 DIAGNOSIS — I10 Essential (primary) hypertension: Secondary | ICD-10-CM | POA: Diagnosis not present

## 2020-03-31 DIAGNOSIS — R079 Chest pain, unspecified: Secondary | ICD-10-CM | POA: Diagnosis not present

## 2020-03-31 DIAGNOSIS — Z8673 Personal history of transient ischemic attack (TIA), and cerebral infarction without residual deficits: Secondary | ICD-10-CM | POA: Diagnosis not present

## 2020-03-31 DIAGNOSIS — I480 Paroxysmal atrial fibrillation: Secondary | ICD-10-CM | POA: Diagnosis not present

## 2020-04-02 DIAGNOSIS — R079 Chest pain, unspecified: Secondary | ICD-10-CM | POA: Diagnosis not present

## 2020-04-06 DIAGNOSIS — R9439 Abnormal result of other cardiovascular function study: Secondary | ICD-10-CM | POA: Diagnosis not present

## 2020-04-06 DIAGNOSIS — I480 Paroxysmal atrial fibrillation: Secondary | ICD-10-CM | POA: Diagnosis not present

## 2020-04-06 DIAGNOSIS — Z8673 Personal history of transient ischemic attack (TIA), and cerebral infarction without residual deficits: Secondary | ICD-10-CM | POA: Diagnosis not present

## 2020-04-06 DIAGNOSIS — I10 Essential (primary) hypertension: Secondary | ICD-10-CM | POA: Diagnosis not present

## 2020-04-07 ENCOUNTER — Telehealth: Payer: Self-pay

## 2020-04-07 DIAGNOSIS — I25118 Atherosclerotic heart disease of native coronary artery with other forms of angina pectoris: Secondary | ICD-10-CM

## 2020-04-07 NOTE — Addendum Note (Signed)
Addended by: Lamar Laundry on: 04/07/2020 04:39 PM   Modules accepted: Orders

## 2020-04-07 NOTE — Telephone Encounter (Addendum)
Spoke with the patients wife Eric Bryant. There were no medications or allergies listed in the patients chart. Updated the patients medication and allergy list as reported by the patients wife. Pt wife Eric Bryant given verbal pre-procdure instructions listed below.  Eric Bryant that the patient will f/u with his Eric Bryant Eric Bryant unless different instructions are given by Eric Bryant.  Eric Bryant verbalized understanding to the instructions given and has no additional questions or concerns at this time.  She does rqst a link for the pt to sign up for mychart be sent to [email or web address]. sent as requested.    [facility] [address] Eric Bryant [place] [address] Dept: [phone] Loc: [phone]  Eric Bryant  04/07/2020  You are scheduled for a Cardiac Catheterization on Wednesday, December 15 with Eric Bryant.  1. Please arrive at the [facility] (Main Entrance A) at [facility]: [address] [place], [place] [address] at 10:00 AM (This time is two hours before your procedure to ensure your preparation). Free valet parking service is available.   Special note: Every effort is made to have your procedure done on time. Please understand that emergencies sometimes delay scheduled procedures.  2. Diet: Do not eat solid foods after midnight.  The patient may have clear liquids until 5am upon the day of the procedure.  3. Labs: CMP and CBC were drwan at your pcps office on 04/06/20 and are viewable in care everywhere. You will need a COVID test on Mon 04/12/20. Please repot to the [facility] medical arts drive up test site.  4. Medication instructions in preparation for your procedure:   Contrast Allergy: No   Stop taking Eliquis (Apixiban) on Sunday, December 12.   HOLD OTC supplements.    On the morning of your procedure, take your Aspirin and any morning medicines NOT  listed above.  You may use sips of water.  5. Plan for one night stay--bring personal belongings. 6. Bring a current list of your medications and current insurance cards. 7. You MUST have a responsible person to drive you home. 8. Someone MUST be with you the first 24 hours after you arrive home or your discharge will be delayed. 9. Please wear clothes that are easy to get on and off and wear slip-on shoes.  Thank you for allowing [place] to care for you!   -- [facility]

## 2020-04-07 NOTE — Telephone Encounter (Signed)
Spoke with the patients. Patients cardiac cath is scheduled for 04/14/20 @ 12pm @ [employer] with Dr. Fletcher Anon.  Patient sts that he is currently at work and will need to call back to go over his pre-procedure instructions.

## 2020-04-07 NOTE — Telephone Encounter (Addendum)
Patient gave verbal permission to give the pre-procedure instructions with his wife Lenna Sciara.   Contacted the patients wife Melissa. There are no allergies or medications listed in the patients chart. Melissa sts that she is not currently home and will need to call back with that info.  Patient did have a cmp and cbc on 04/06/20 results are available in care everywhere.

## 2020-04-07 NOTE — Telephone Encounter (Signed)
-----   Message from Wellington Hampshire, MD sent at 04/06/2020  5:45 PM EST ----- This is a patient of Dr. Ubaldo Glassing with chest pain and abnormal stress test. Dr. Ubaldo Glassing requested I do left heart cath and possible PCI at [facility]. I can do next week on Wednesday . Please arrange. Thanks.

## 2020-04-12 ENCOUNTER — Telehealth: Payer: Self-pay | Admitting: *Deleted

## 2020-04-12 ENCOUNTER — Other Ambulatory Visit: Payer: Self-pay

## 2020-04-12 ENCOUNTER — Other Ambulatory Visit
Admission: RE | Admit: 2020-04-12 | Discharge: 2020-04-12 | Disposition: A | Discharge status: Home / Self Care | Payer: PPO | Source: Ambulatory Visit | Attending: Cardiovascular Disease | Admitting: Cardiovascular Disease

## 2020-04-12 DIAGNOSIS — Z20822 Contact with and (suspected) exposure to covid-19: Secondary | ICD-10-CM | POA: Diagnosis not present

## 2020-04-12 DIAGNOSIS — Z01812 Encounter for preprocedural laboratory examination: Secondary | ICD-10-CM | POA: Insufficient documentation

## 2020-04-12 NOTE — Telephone Encounter (Signed)
[facility]HC 04/14/20-pre-procedure COVID-19 test this morning. Call placed to patient to review procedure instructions.  I spoke with patient and he was at his work, states he was not told he should quarantine from time of COVID-19 test until reporting to hospital for procedure 04/14/20. 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 Pt states if he does need to repeat COVID-19 test in the morning, he would not be able to stay home to quarantine, but could quarantine in an office at his business. Pt reports he has not had any symptoms concerning for COVID-19. Pt reports he has not been vaccinated for COVID-19.  Pt advised I will forward to Dr Fletcher Anon for recommendation about pre-procedure COVID-19 test: -Need to repeat COVID-19 test since he has not quarantined since COVID-19 this morning -If he does repeat tomorrow, would not be able to quarantine at home, states he would quarantine at his business.

## 2020-04-12 NOTE — Telephone Encounter (Addendum)
Pt contacted pre-catheterization scheduled at [facility] for: Wednesday April 14, 2020 12 Noon Verified arrival time and place: [facility]) at: 10 AM   No solid food after midnight prior to cath, clear liquids until 5 AM day of procedure.  Hold: Eliquis-none 04/12/20 until post procedure  Except hold medications AM meds can be  taken pre-cath with sips of water including: ASA 81 mg   Confirmed patient has responsible adult to drive home post procedure and be with patient first 24 hours after arriving home: yes  You are allowed ONE visitor in the waiting room during the time you are at the hospital for your procedure. Both you and your visitor must wear a mask once you enter the hospital.       COVID-19 Pre-Screening Questions:  . In the past 14 days have you had any symptoms concerning for COVID-19 infection (fever, chills, cough, or new shortness of breath)? no . In the past 14 days have you been around anyone with known Covid 19? No   Reviewed procedure/mask/visitor instructions, COVID-19 questions with patient's wife (pt verbal permission).     04/06/20 Chem profile/CBC  in Care Everywhere. 04/06/20 platelet count 252 under "Others"  in Care Everywhere.

## 2020-04-13 LAB — SARS CORONAVIRUS 2 (TAT 6-24 HRS): SARS Coronavirus 2: NEGATIVE

## 2020-04-13 NOTE — Telephone Encounter (Signed)
Received message from RN [facility]-We will have to re swab patient in the morning. Tell him to arrive @ 0900.             Call placed to patient's wife, she is aware patient should arrive tomorrow morning 9 AM for COVID-19 retest prior to procedure at 12 Noon.

## 2020-04-13 NOTE — Telephone Encounter (Signed)
COVID-19 test 04/12/20 negative. Pt has not quarantined at home since this test was done 04/12/20.

## 2020-04-14 ENCOUNTER — Other Ambulatory Visit: Payer: Self-pay

## 2020-04-14 ENCOUNTER — Encounter (HOSPITAL_COMMUNITY)
Admission: RE | Disposition: A | Discharge status: Home / Self Care | Payer: Self-pay | Source: Home / Self Care | Attending: Cardiovascular Disease

## 2020-04-14 ENCOUNTER — Ambulatory Visit (HOSPITAL_COMMUNITY)
Admission: RE | Admit: 2020-04-14 | Discharge: 2020-04-14 | Disposition: A | Discharge status: Home / Self Care | Payer: PPO | Attending: Cardiovascular Disease | Admitting: Cardiovascular Disease

## 2020-04-14 DIAGNOSIS — Z7982 Long term (current) use of aspirin: Secondary | ICD-10-CM | POA: Diagnosis not present

## 2020-04-14 DIAGNOSIS — Z79899 Other long term (current) drug therapy: Secondary | ICD-10-CM | POA: Insufficient documentation

## 2020-04-14 DIAGNOSIS — Z8249 Family history of ischemic heart disease and other diseases of the circulatory system: Secondary | ICD-10-CM | POA: Insufficient documentation

## 2020-04-14 DIAGNOSIS — I25118 Atherosclerotic heart disease of native coronary artery with other forms of angina pectoris: Secondary | ICD-10-CM | POA: Insufficient documentation

## 2020-04-14 DIAGNOSIS — I129 Hypertensive chronic kidney disease with stage 1 through stage 4 chronic kidney disease, or unspecified chronic kidney disease: Secondary | ICD-10-CM | POA: Insufficient documentation

## 2020-04-14 DIAGNOSIS — Z7901 Long term (current) use of anticoagulants: Secondary | ICD-10-CM | POA: Diagnosis not present

## 2020-04-14 DIAGNOSIS — I2582 Chronic total occlusion of coronary artery: Secondary | ICD-10-CM | POA: Insufficient documentation

## 2020-04-14 DIAGNOSIS — E785 Hyperlipidemia, unspecified: Secondary | ICD-10-CM | POA: Diagnosis not present

## 2020-04-14 DIAGNOSIS — I25119 Atherosclerotic heart disease of native coronary artery with unspecified angina pectoris: Secondary | ICD-10-CM | POA: Diagnosis not present

## 2020-04-14 DIAGNOSIS — I480 Paroxysmal atrial fibrillation: Secondary | ICD-10-CM | POA: Diagnosis not present

## 2020-04-14 DIAGNOSIS — N189 Chronic kidney disease, unspecified: Secondary | ICD-10-CM | POA: Diagnosis not present

## 2020-04-14 DIAGNOSIS — Z20822 Contact with and (suspected) exposure to covid-19: Secondary | ICD-10-CM | POA: Diagnosis not present

## 2020-04-14 DIAGNOSIS — I208 Other forms of angina pectoris: Secondary | ICD-10-CM | POA: Diagnosis present

## 2020-04-14 DIAGNOSIS — Z87891 Personal history of nicotine dependence: Secondary | ICD-10-CM | POA: Diagnosis not present

## 2020-04-14 HISTORY — PX: LEFT HEART CATH AND CORONARY ANGIOGRAPHY: CATH118249

## 2020-04-14 LAB — CBC
HCT: 47.4 % (ref 39.0–52.0)
Hemoglobin: 16 g/dL (ref 13.0–17.0)
MCH: 31.3 pg (ref 26.0–34.0)
MCHC: 33.8 g/dL (ref 30.0–36.0)
MCV: 92.8 fL (ref 80.0–100.0)
Platelets: 227 10*3/uL (ref 150–400)
RBC: 5.11 MIL/uL (ref 4.22–5.81)
RDW: 12.1 % (ref 11.5–15.5)
WBC: 5.6 10*3/uL (ref 4.0–10.5)
nRBC: 0 % (ref 0.0–0.2)

## 2020-04-14 LAB — BASIC METABOLIC PANEL
Anion gap: 9 (ref 5–15)
BUN: 27 mg/dL — ABNORMAL HIGH (ref 8–23)
CO2: 25 mmol/L (ref 22–32)
Calcium: 10.4 mg/dL — ABNORMAL HIGH (ref 8.9–10.3)
Chloride: 102 mmol/L (ref 98–111)
Creatinine, Ser: 1.63 mg/dL — ABNORMAL HIGH (ref 0.61–1.24)
GFR, Estimated: 46 mL/min — ABNORMAL LOW (ref >60)
Glucose, Bld: 104 mg/dL — ABNORMAL HIGH (ref 70–99)
Potassium: 4.1 mmol/L (ref 3.5–5.1)
Sodium: 136 mmol/L (ref 135–145)

## 2020-04-14 LAB — SARS CORONAVIRUS 2 BY RT PCR (HOSPITAL ORDER, PERFORMED IN ~~LOC~~ HOSPITAL LAB): SARS Coronavirus 2: NEGATIVE

## 2020-04-14 SURGERY — LEFT HEART CATH AND CORONARY ANGIOGRAPHY
Anesthesia: LOCAL

## 2020-04-14 MED ORDER — SODIUM CHLORIDE 0.9 % IV SOLN: 250.0000 mL | INTRAVENOUS | Status: DC | PRN

## 2020-04-14 MED ORDER — SODIUM CHLORIDE 0.9% FLUSH: 3.0000 mL | INTRAVENOUS | Status: DC | PRN

## 2020-04-14 MED ORDER — HEPARIN SODIUM (PORCINE) 1000 UNIT/ML IJ SOLN
INTRAMUSCULAR | Status: AC
  Filled 2020-04-14: qty 1

## 2020-04-14 MED ORDER — SODIUM CHLORIDE 0.9 % IV SOLN: INTRAVENOUS | Status: DC

## 2020-04-14 MED ORDER — FENTANYL CITRATE (PF) 100 MCG/2ML IJ SOLN
INTRAMUSCULAR | Status: AC
  Filled 2020-04-14: qty 2

## 2020-04-14 MED ORDER — ACETAMINOPHEN 325 MG PO TABS: 650.0000 mg | ORAL_TABLET | ORAL | Status: DC | PRN

## 2020-04-14 MED ORDER — IOHEXOL 350 MG/ML SOLN
INTRAVENOUS | Status: DC | PRN
  Administered 2020-04-14: 25 mL

## 2020-04-14 MED ORDER — HEPARIN (PORCINE) IN NACL 1000-0.9 UT/500ML-% IV SOLN
INTRAVENOUS | Status: DC | PRN
  Administered 2020-04-14 (×2): 500 mL

## 2020-04-14 MED ORDER — SODIUM CHLORIDE 0.9 % WEIGHT BASED INFUSION
3.0000 mL/kg/h | INTRAVENOUS | Status: AC
  Administered 2020-04-14: 3 mL/kg/h via INTRAVENOUS

## 2020-04-14 MED ORDER — SODIUM CHLORIDE 0.9% FLUSH: 3.0000 mL | Freq: Two times a day (BID) | INTRAVENOUS | Status: DC

## 2020-04-14 MED ORDER — AMLODIPINE BESYLATE 5 MG PO TABS: 5.0000 mg | ORAL_TABLET | Freq: Every day | ORAL | 3 refills | Status: DC

## 2020-04-14 MED ORDER — MIDAZOLAM HCL 2 MG/2ML IJ SOLN
INTRAMUSCULAR | Status: DC | PRN
  Administered 2020-04-14: 2 mg via INTRAVENOUS

## 2020-04-14 MED ORDER — HEPARIN (PORCINE) IN NACL 1000-0.9 UT/500ML-% IV SOLN
INTRAVENOUS | Status: AC
  Filled 2020-04-14: qty 1000

## 2020-04-14 MED ORDER — ONDANSETRON HCL 4 MG/2ML IJ SOLN: 4.0000 mg | Freq: Four times a day (QID) | INTRAMUSCULAR | Status: DC | PRN

## 2020-04-14 MED ORDER — LIDOCAINE HCL (PF) 1 % IJ SOLN
INTRAMUSCULAR | Status: DC | PRN
  Administered 2020-04-14: 2 mL via INTRADERMAL

## 2020-04-14 MED ORDER — ASPIRIN 81 MG PO CHEW: 81.0000 mg | CHEWABLE_TABLET | ORAL | Status: DC

## 2020-04-14 MED ORDER — VERAPAMIL HCL 2.5 MG/ML IV SOLN
INTRAVENOUS | Status: DC | PRN
  Administered 2020-04-14: 10 mL via INTRA_ARTERIAL

## 2020-04-14 MED ORDER — SODIUM CHLORIDE 0.9 % IV SOLN
INTRAVENOUS | Status: AC | PRN
  Administered 2020-04-14: 125 mL/h via INTRAVENOUS

## 2020-04-14 MED ORDER — ROSUVASTATIN CALCIUM 20 MG PO TABS: 20.0000 mg | ORAL_TABLET | Freq: Every day | ORAL | 3 refills | Status: DC

## 2020-04-14 MED ORDER — VERAPAMIL HCL 2.5 MG/ML IV SOLN
INTRAVENOUS | Status: AC
  Filled 2020-04-14: qty 2

## 2020-04-14 MED ORDER — FENTANYL CITRATE (PF) 100 MCG/2ML IJ SOLN
INTRAMUSCULAR | Status: DC | PRN
  Administered 2020-04-14: 50 ug via INTRAVENOUS

## 2020-04-14 MED ORDER — MIDAZOLAM HCL 2 MG/2ML IJ SOLN
INTRAMUSCULAR | Status: AC
  Filled 2020-04-14: qty 2

## 2020-04-14 MED ORDER — LIDOCAINE HCL (PF) 1 % IJ SOLN
INTRAMUSCULAR | Status: AC
  Filled 2020-04-14: qty 30

## 2020-04-14 MED ORDER — SODIUM CHLORIDE 0.9 % WEIGHT BASED INFUSION: 1.0000 mL/kg/h | INTRAVENOUS | Status: DC

## 2020-04-14 MED ORDER — HEPARIN SODIUM (PORCINE) 1000 UNIT/ML IJ SOLN
INTRAMUSCULAR | Status: DC | PRN
  Administered 2020-04-14: 4000 [IU] via INTRAVENOUS

## 2020-04-14 SURGICAL SUPPLY — 10 items
CATH INFINITI 5FR JK (CATHETERS) ×2 IMPLANT
DEVICE RAD COMP TR BAND LRG (VASCULAR PRODUCTS) ×2 IMPLANT
GLIDESHEATH SLEND SS 6F .021 (SHEATH) ×2 IMPLANT
GUIDEWIRE INQWIRE 1.5J.035X260 (WIRE) ×1 IMPLANT
INQWIRE 1.5J .035X260CM (WIRE) ×2
KIT HEART LEFT (KITS) ×2 IMPLANT
PACK CARDIAC CATHETERIZATION (CUSTOM PROCEDURE TRAY) ×2 IMPLANT
TRANSDUCER W/STOPCOCK (MISCELLANEOUS) ×2 IMPLANT
TUBING CIL FLEX 10 FLL-RA (TUBING) ×2 IMPLANT
WIRE HI TORQ VERSACORE-J 145CM (WIRE) ×2 IMPLANT

## 2020-04-14 NOTE — H&P (Signed)
Doristine Mango [place], [place] - 04/06/2020 3:30 PM EST Formatting of this note is different from the original. Images from the original note were not included. Established Patient Visit   Chief Complaint: Chief Complaint  Patient presents with  . Follow-up  myoview  Date of Service: 04/06/2020 Date of Birth: Jan 29, 1956 PCP: Dion Body, MD  History of Present Illness: Mr. Clairmont is a 64 y.o.male patient with a past medical history significant for paroxsymal atrial fibrillation, rhythm controlled with sotalol and anticoagulated with Eliquis, history of a CVA, and hypertension who presents to review stress test results. He presented as an acute visit on 03/31/20 due to exertional chest discomfort, radiating to bilateral shoulders and between his shoulder blades. A stress test was obtained in which he experienced severe exertional dyspnea, achieving only 3 minutes on the Bruce protocol. Since the last office visit, symptoms have improved, but he admits to occasional chest tightness during stressful situations at his job. He denies lower extremity swelling, orthopnea, or PND. He denies dizziness, lightheadedness, or syncopal/presyncopal episodes. Sotalol was recently decreased from 160mg  BID to 160mg  daily due to worsening renal function, and he has tolerated this change well without evidence of breakthrough atrial fibrillation. He remains on aspirin and Eliquis without evidence of bleeding.   We discussed the results of the stress test at today's visit which revealed poor exercise tolerance, achieving 3 minutes on the Bruce protocol with EF mildly reduced at 47% with evidence of inferior reversible ischemia.   Past Medical and Surgical History  Past Medical History Past Medical History:  Diagnosis Date  . Atrial fibrillation (CMS-HCC)  . History of CVA (2015)  . Hypertension   Past Surgical History He has no past surgical history on file.   Medications and Allergies  Current  Medications  Current Outpatient Medications  Medication Sig Dispense Refill  . aspirin 81 MG chewable tablet Take 81 mg by mouth once daily.  . cyanocobalamin (VITAMIN B12) 1000 MCG tablet Take 1,000 mcg by mouth once daily.  Marland Kitchen ELIQUIS 5 mg tablet TAKE 1 TABLET(5 MG) BY MOUTH EVERY 12 HOURS 60 tablet 11  . Herbal Supplement Echinacea 760mg - 1 tablet PO QAM  . metoprolol succinate (TOPROL-XL) 25 MG XL tablet TAKE 1 TABLET BY MOUTH EVERY DAY 90 tablet 1  . multivitamin tablet Take 1 tablet by mouth once daily.  Marland Kitchen omega-3 fatty acids-fish oil 360-1,200 mg Cap Take 1 capsule by mouth every morning.  . RED YEAST RICE ORAL Take by mouth 2 (two) times daily.   . sotaloL (BETAPACE) 160 MG tablet TAKE 1 TABLET BY MOUTH TWICE DAILY 180 tablet 1  . UNABLE TO FIND Prosta-Metto- 2 tablets PO QAM   No current facility-administered medications for this visit.   Allergies: Penicillin g potassium  Social and Family History  Social History reports that he quit smoking about 24 years ago. His smoking use included cigarettes. He smoked 0.25 packs per day. He quit smokeless tobacco use about 19 years ago. His smokeless tobacco use included chew. He reports that he does not drink alcohol and does not use drugs.  Family History Family History  Problem Relation Age of Onset  . Cancer Mother  breast carcinoma  . Heart disease Father  . Skin cancer Father  . Myocardial Infarction (Heart attack) Father  . Prostate cancer Father  . Hyperlipidemia (Elevated cholesterol) Brother   Review of Systems   Review of Systems: The patient denies chest pain, shortness of breath, orthopnea, paroxysmal nocturnal dyspnea, pedal edema,  palpitations, heart racing, fatigue, dizziness, lightheadedness, presyncope, syncope, leg pain, leg cramping. Review of 12 Systems is negative except as described in HPI.   Physical Examination   Vitals:BP 140/84  Pulse 69  Resp 16  Ht 175.3 cm (5\' 9" )  Wt 83.5 kg (184 lb)  SpO2  98%  BMI 27.17 kg/m  Ht:175.3 cm (5\' 9" ) Wt:83.5 kg (184 lb) WGN:FAOZ surface area is 2.02 meters squared. Body mass index is 27.17 kg/m.  General: Well developed, well nourished. In no acute distress HEENT: Pupils equally reactive to light and accomodation  Neck: Supple without thyromegaly, or goiter. Carotid pulses 2+. No carotid bruits present.  Pulmonary: Clear to auscultation bilaterally; no wheezes, rales, rhonchi Cardiovascular: Regular rate and rhythm. No gallops, murmurs or rubs Gastrointestinal: Soft nontender, nondistended, with normal bowel sounds Extremities: No cyanosis, clubbing, or edema Peripheral Pulses: 2+ in upper extremities, 2+ in lower extremities  Neurology: Alert and oriented X3 Pysch: Good affect. Responds appropriately  Assessment and Plan   64 y.o. male with  1. Paroxysmal atrial fibrillation on Eliquis - followed by Dr. Ubaldo Glassing  -Continue sotalol 160mg  once daily and metoprolol succinate 25mg  once daily  -Continue Eliquis 5mg  BID until 48 hours prior to [facility]  2. Essential hypertension  -Continue metoprolol 25mg  daily for now; further recommendations will be made following LHC  3. History of CVA (2015)  -Currently on aspirin and Eliquis  4. Abnormal cardiovascular stress test  -With typical angina and evidence of inferior ischemia on ETT Myoview, will further assess with a LHC at [facility] in [place] per patient request - Dr. Fletcher Anon contacted  -We had a long discussion regarding benefits and risks of a left heart catheterization including risks of bleeding, infection, pseudoaneurysm, heart attack, stroke, and death. Patient aware of the various outcomes of the procedure including recommendation for medical management, the need for coronary intervention, and potential referral for CABG. Patient voiced understanding and wishes to proceed with the procedure as discussed -CBC, BMP obtained     Orders Placed This Encounter  Procedures  . Basic Metabolic  Panel (BMP)  . Complete Blood Count (CBC)   No follow-ups on file.  I personally performed the service, non-incident to. (WP)   NICOLE Julian Hy, PA    Electronically signed by Zeb Comfort, PA at 04/07/2020 6:21 PM EST   Addendum on December 85, 8514. 64 year old male with history of paroxysmal atrial fibrillation on sotalol treatment and long-term anticoagulation with Eliquis, essential hypertension, hyperlipidemia and family history of premature coronary artery disease.  In addition, he had TIA in the past.  He has been having exertional chest discomfort that tightness over the last few months that happens with overexertion.  Usually it lasts for 10 to 15 minutes and resolved with rest.  Due to that, he underwent a treadmill nuclear stress test which revealed evidence of inferior wall ischemia and poor exercise tolerance with exercise duration of only 30 minutes.  However, he reports that he was in atrial fibrillation that morning and his heart rate was already at 120 bpm at rest.  He reports increased stress lately. Due to his symptoms and stress test findings, the patient was referred to me for left heart catheterization possible PCI by his primary cardiologist Dr. Ubaldo Glassing and Laveda Norman.  By physical exam, heart is regular with no murmurs.  Lungs are clear to auscultation with no significant leg edema.  Right radial pulses normal.  I discussed the procedure in details as well as risk  and benefits.  I especially explained to him the risk of contrast-induced nephropathy given that he has underlying chronic kidney disease.  Baseline creatinine is usually around 1.4-1.5.  Today, his creatinine is 1.6.  The patient has been hydrated and will continue to hydrate post-cath.  In addition, we will try to minimize the amount of contrast and avoid left ventricular angiography.  I answered all his questions and he is ready to proceed.

## 2020-04-14 NOTE — Discharge Instructions (Signed)
Resume Eliquis tomorrow as long as no bleeding issues from the right radial artery. A prescription of rosuvastatin (Crestor) and amlodipine were sent to your [facility] pharmacy in [place]  This sheet gives you information about how to care for yourself after your procedure. Your health care provider may also give you more specific instructions. If you have problems or questions, contact your health care provider. What can I expect after the procedure? After the procedure, it is common to have:  Bruising and tenderness at the catheter insertion area. Follow these instructions at home: Medicines  Take over-the-counter and prescription medicines only as told by your health care provider. Insertion site care 1. Follow instructions from your health care provider about how to take care of your insertion site. Make sure you: ? Wash your hands with soap and water before you change your bandage (dressing). If soap and water are not available, use hand sanitizer. ? Change your dressing as told by your health care provider. 2. Check your insertion site every day for signs of infection. Check for: ? Redness, swelling, or pain. ? Fluid or blood. ? Pus or a bad smell. ? Warmth. 3. Do not take baths, swim, or use a hot tub for 5 days. 4. You may shower 24-48 hours after the procedure. ? Remove the dressing and gently wash the site with plain soap and water. ? Pat the area dry with a clean towel. ? Do not rub the site. That could cause bleeding. 5. Do not apply powder or lotion to the site. Activity  1. For 24 hours after the procedure, or as directed by your health care provider: ? Do not flex or bend the affected arm. ? Do not push or pull heavy objects with the affected arm. ? Do not drive yourself home from the hospital or clinic. You may drive 24 hours after the procedure. ? Do not operate machinery or power tools. ? KEEP ARM ELEVATED THE REMAINDER OF THE DAY. 2. Do not  push, pull or lift anything that is heavier than 10 lb for 5 days. 3. Ask your health care provider when it is okay to: ? Return to work or school. ? Resume usual physical activities or sports. ? Resume sexual activity. General instructions  If the catheter site starts to bleed, raise your arm and put firm pressure on the site. If the bleeding does not stop, get help right away. This is a medical emergency.  DRINK PLENTY OF FLUIDS FOR THE NEXT 2-3 DAYS.  If you went home on the same day as your procedure, a responsible adult should be with you for the first 24 hours after you arrive home.  Keep all follow-up visits as told by your health care provider. This is important. Contact a health care provider if:  You have a fever.  You have redness, swelling, or yellow drainage around your insertion site. Get help right away if:  You have unusual pain at the radial site.  The catheter insertion area swells very fast.  The insertion area is bleeding, and the bleeding does not stop when you hold steady pressure on the area.  Your arm or hand becomes pale, cool, tingly, or numb. These symptoms may represent a serious problem that is an emergency. Do not wait to see if the symptoms will go away. Get medical help right away. Call your local emergency services (911 in the [place] Do not drive yourself to the hospital. Summary  After the  procedure, it is common to have bruising and tenderness at the site.  Follow instructions from your health care provider about how to take care of your radial site wound. Check the wound every day for signs of infection.  Do not push, pull or lift anything that is heavier than 10 lb for 5 days.  This information is not intended to replace advice given to you by your health care provider. Make sure you discuss any questions you have with your health care provider. Document Revised: 05/23/2017 Document Reviewed: 05/23/2017 Elsevier Patient Education  2020  [employer].

## 2020-04-14 NOTE — Progress Notes (Signed)
Discharge instructions reviewed with pt and his wife both voice understanding.

## 2020-04-14 NOTE — Progress Notes (Signed)
Ambulated to the bathroom to void tol well  

## 2020-04-15 ENCOUNTER — Encounter (HOSPITAL_COMMUNITY): Payer: Self-pay | Admitting: Cardiovascular Disease

## 2020-04-28 DIAGNOSIS — Z8673 Personal history of transient ischemic attack (TIA), and cerebral infarction without residual deficits: Secondary | ICD-10-CM | POA: Diagnosis not present

## 2020-04-28 DIAGNOSIS — I480 Paroxysmal atrial fibrillation: Secondary | ICD-10-CM | POA: Diagnosis not present

## 2020-04-28 DIAGNOSIS — I10 Essential (primary) hypertension: Secondary | ICD-10-CM | POA: Diagnosis not present

## 2020-04-28 DIAGNOSIS — N1831 Chronic kidney disease, stage 3a: Secondary | ICD-10-CM | POA: Diagnosis not present

## 2020-04-28 DIAGNOSIS — I25118 Atherosclerotic heart disease of native coronary artery with other forms of angina pectoris: Secondary | ICD-10-CM | POA: Diagnosis not present

## 2020-05-05 ENCOUNTER — Telehealth: Payer: Self-pay | Admitting: Cardiovascular Disease

## 2020-05-05 NOTE — Telephone Encounter (Signed)
Patient's wife is requesting a disk copy of the patient's heart cath completed on 04/14/20. Please assist.

## 2020-05-05 NOTE — Telephone Encounter (Signed)
Spoke with wife and provided hospital medical records phone #

## 2020-05-26 DIAGNOSIS — I25118 Atherosclerotic heart disease of native coronary artery with other forms of angina pectoris: Secondary | ICD-10-CM | POA: Diagnosis not present

## 2020-06-03 DIAGNOSIS — D2271 Melanocytic nevi of right lower limb, including hip: Secondary | ICD-10-CM | POA: Diagnosis not present

## 2020-06-03 DIAGNOSIS — L538 Other specified erythematous conditions: Secondary | ICD-10-CM | POA: Diagnosis not present

## 2020-06-03 DIAGNOSIS — D225 Melanocytic nevi of trunk: Secondary | ICD-10-CM | POA: Diagnosis not present

## 2020-06-03 DIAGNOSIS — L820 Inflamed seborrheic keratosis: Secondary | ICD-10-CM | POA: Diagnosis not present

## 2020-06-03 DIAGNOSIS — X32XXXA Exposure to sunlight, initial encounter: Secondary | ICD-10-CM | POA: Diagnosis not present

## 2020-06-03 DIAGNOSIS — D2262 Melanocytic nevi of left upper limb, including shoulder: Secondary | ICD-10-CM | POA: Diagnosis not present

## 2020-06-03 DIAGNOSIS — D2272 Melanocytic nevi of left lower limb, including hip: Secondary | ICD-10-CM | POA: Diagnosis not present

## 2020-06-03 DIAGNOSIS — D2261 Melanocytic nevi of right upper limb, including shoulder: Secondary | ICD-10-CM | POA: Diagnosis not present

## 2020-06-03 DIAGNOSIS — L570 Actinic keratosis: Secondary | ICD-10-CM | POA: Diagnosis not present

## 2020-06-03 DIAGNOSIS — L821 Other seborrheic keratosis: Secondary | ICD-10-CM | POA: Diagnosis not present

## 2020-08-02 ENCOUNTER — Other Ambulatory Visit: Payer: Self-pay | Admitting: Cardiovascular Disease

## 2020-08-06 ENCOUNTER — Telehealth: Payer: Self-pay | Admitting: Cardiovascular Disease

## 2020-08-06 NOTE — Telephone Encounter (Signed)
Patient wife calling to discuss refills .  Per wife dr. Fletcher Anon did a procedure previously but patient is only followed by Dr. Ubaldo Glassing.    Wife will reach out to [facility] to discuss medications .   Medication refills previously denied by [facility]

## 2020-10-14 DIAGNOSIS — I25118 Atherosclerotic heart disease of native coronary artery with other forms of angina pectoris: Secondary | ICD-10-CM | POA: Diagnosis not present

## 2020-10-20 DIAGNOSIS — Z8673 Personal history of transient ischemic attack (TIA), and cerebral infarction without residual deficits: Secondary | ICD-10-CM | POA: Diagnosis not present

## 2020-10-20 DIAGNOSIS — I10 Essential (primary) hypertension: Secondary | ICD-10-CM | POA: Diagnosis not present

## 2020-10-20 DIAGNOSIS — I25118 Atherosclerotic heart disease of native coronary artery with other forms of angina pectoris: Secondary | ICD-10-CM | POA: Diagnosis not present

## 2020-10-20 DIAGNOSIS — I480 Paroxysmal atrial fibrillation: Secondary | ICD-10-CM | POA: Diagnosis not present

## 2020-11-22 DIAGNOSIS — M216X1 Other acquired deformities of right foot: Secondary | ICD-10-CM | POA: Diagnosis not present

## 2020-11-22 DIAGNOSIS — S99921A Unspecified injury of right foot, initial encounter: Secondary | ICD-10-CM | POA: Diagnosis not present

## 2020-11-22 DIAGNOSIS — M7741 Metatarsalgia, right foot: Secondary | ICD-10-CM | POA: Diagnosis not present

## 2020-11-22 DIAGNOSIS — M2041 Other hammer toe(s) (acquired), right foot: Secondary | ICD-10-CM | POA: Diagnosis not present

## 2020-11-22 DIAGNOSIS — M79671 Pain in right foot: Secondary | ICD-10-CM | POA: Diagnosis not present

## 2021-03-22 DIAGNOSIS — M5137 Other intervertebral disc degeneration, lumbosacral region: Secondary | ICD-10-CM | POA: Diagnosis not present

## 2021-03-22 DIAGNOSIS — M5441 Lumbago with sciatica, right side: Secondary | ICD-10-CM | POA: Diagnosis not present

## 2021-03-22 DIAGNOSIS — M9904 Segmental and somatic dysfunction of sacral region: Secondary | ICD-10-CM | POA: Diagnosis not present

## 2021-03-22 DIAGNOSIS — M9903 Segmental and somatic dysfunction of lumbar region: Secondary | ICD-10-CM | POA: Diagnosis not present

## 2021-03-23 DIAGNOSIS — M9904 Segmental and somatic dysfunction of sacral region: Secondary | ICD-10-CM | POA: Diagnosis not present

## 2021-03-23 DIAGNOSIS — M9903 Segmental and somatic dysfunction of lumbar region: Secondary | ICD-10-CM | POA: Diagnosis not present

## 2021-03-23 DIAGNOSIS — M5137 Other intervertebral disc degeneration, lumbosacral region: Secondary | ICD-10-CM | POA: Diagnosis not present

## 2021-03-23 DIAGNOSIS — M5441 Lumbago with sciatica, right side: Secondary | ICD-10-CM | POA: Diagnosis not present

## 2021-03-28 DIAGNOSIS — M5137 Other intervertebral disc degeneration, lumbosacral region: Secondary | ICD-10-CM | POA: Diagnosis not present

## 2021-03-28 DIAGNOSIS — M9903 Segmental and somatic dysfunction of lumbar region: Secondary | ICD-10-CM | POA: Diagnosis not present

## 2021-03-28 DIAGNOSIS — M9904 Segmental and somatic dysfunction of sacral region: Secondary | ICD-10-CM | POA: Diagnosis not present

## 2021-03-28 DIAGNOSIS — M5441 Lumbago with sciatica, right side: Secondary | ICD-10-CM | POA: Diagnosis not present

## 2021-03-29 DIAGNOSIS — M5441 Lumbago with sciatica, right side: Secondary | ICD-10-CM | POA: Diagnosis not present

## 2021-03-29 DIAGNOSIS — M9904 Segmental and somatic dysfunction of sacral region: Secondary | ICD-10-CM | POA: Diagnosis not present

## 2021-03-29 DIAGNOSIS — M9903 Segmental and somatic dysfunction of lumbar region: Secondary | ICD-10-CM | POA: Diagnosis not present

## 2021-03-29 DIAGNOSIS — M5137 Other intervertebral disc degeneration, lumbosacral region: Secondary | ICD-10-CM | POA: Diagnosis not present

## 2021-03-30 DIAGNOSIS — M5441 Lumbago with sciatica, right side: Secondary | ICD-10-CM | POA: Diagnosis not present

## 2021-03-30 DIAGNOSIS — M9904 Segmental and somatic dysfunction of sacral region: Secondary | ICD-10-CM | POA: Diagnosis not present

## 2021-03-30 DIAGNOSIS — M5137 Other intervertebral disc degeneration, lumbosacral region: Secondary | ICD-10-CM | POA: Diagnosis not present

## 2021-03-30 DIAGNOSIS — M9903 Segmental and somatic dysfunction of lumbar region: Secondary | ICD-10-CM | POA: Diagnosis not present

## 2021-03-31 DIAGNOSIS — M9904 Segmental and somatic dysfunction of sacral region: Secondary | ICD-10-CM | POA: Diagnosis not present

## 2021-03-31 DIAGNOSIS — M5137 Other intervertebral disc degeneration, lumbosacral region: Secondary | ICD-10-CM | POA: Diagnosis not present

## 2021-03-31 DIAGNOSIS — M9903 Segmental and somatic dysfunction of lumbar region: Secondary | ICD-10-CM | POA: Diagnosis not present

## 2021-03-31 DIAGNOSIS — M5441 Lumbago with sciatica, right side: Secondary | ICD-10-CM | POA: Diagnosis not present

## 2021-04-01 DIAGNOSIS — M9903 Segmental and somatic dysfunction of lumbar region: Secondary | ICD-10-CM | POA: Diagnosis not present

## 2021-04-01 DIAGNOSIS — M5441 Lumbago with sciatica, right side: Secondary | ICD-10-CM | POA: Diagnosis not present

## 2021-04-01 DIAGNOSIS — M5137 Other intervertebral disc degeneration, lumbosacral region: Secondary | ICD-10-CM | POA: Diagnosis not present

## 2021-04-01 DIAGNOSIS — M9904 Segmental and somatic dysfunction of sacral region: Secondary | ICD-10-CM | POA: Diagnosis not present

## 2021-04-04 DIAGNOSIS — M9904 Segmental and somatic dysfunction of sacral region: Secondary | ICD-10-CM | POA: Diagnosis not present

## 2021-04-04 DIAGNOSIS — I10 Essential (primary) hypertension: Secondary | ICD-10-CM | POA: Diagnosis not present

## 2021-04-04 DIAGNOSIS — M9903 Segmental and somatic dysfunction of lumbar region: Secondary | ICD-10-CM | POA: Diagnosis not present

## 2021-04-04 DIAGNOSIS — N1831 Chronic kidney disease, stage 3a: Secondary | ICD-10-CM | POA: Diagnosis not present

## 2021-04-04 DIAGNOSIS — M5441 Lumbago with sciatica, right side: Secondary | ICD-10-CM | POA: Diagnosis not present

## 2021-04-04 DIAGNOSIS — M5137 Other intervertebral disc degeneration, lumbosacral region: Secondary | ICD-10-CM | POA: Diagnosis not present

## 2021-04-04 DIAGNOSIS — Z125 Encounter for screening for malignant neoplasm of prostate: Secondary | ICD-10-CM | POA: Diagnosis not present

## 2021-04-06 ENCOUNTER — Ambulatory Visit
Admission: RE | Admit: 2021-04-06 | Discharge: 2021-04-06 | Disposition: A | Discharge status: Home / Self Care | Payer: PPO | Source: Ambulatory Visit | Attending: Family Medicine | Admitting: Family Medicine

## 2021-04-06 ENCOUNTER — Other Ambulatory Visit (HOSPITAL_COMMUNITY): Payer: Self-pay | Admitting: Family Medicine

## 2021-04-06 ENCOUNTER — Other Ambulatory Visit: Payer: Self-pay | Admitting: Family Medicine

## 2021-04-06 DIAGNOSIS — I25118 Atherosclerotic heart disease of native coronary artery with other forms of angina pectoris: Secondary | ICD-10-CM | POA: Diagnosis not present

## 2021-04-06 DIAGNOSIS — M47816 Spondylosis without myelopathy or radiculopathy, lumbar region: Secondary | ICD-10-CM | POA: Diagnosis not present

## 2021-04-06 DIAGNOSIS — Z8673 Personal history of transient ischemic attack (TIA), and cerebral infarction without residual deficits: Secondary | ICD-10-CM | POA: Diagnosis not present

## 2021-04-06 DIAGNOSIS — M5431 Sciatica, right side: Secondary | ICD-10-CM | POA: Insufficient documentation

## 2021-04-06 DIAGNOSIS — I480 Paroxysmal atrial fibrillation: Secondary | ICD-10-CM | POA: Diagnosis not present

## 2021-04-06 DIAGNOSIS — I10 Essential (primary) hypertension: Secondary | ICD-10-CM | POA: Diagnosis not present

## 2021-04-19 DIAGNOSIS — M5416 Radiculopathy, lumbar region: Secondary | ICD-10-CM | POA: Diagnosis not present

## 2021-04-19 DIAGNOSIS — M4150 Other secondary scoliosis, site unspecified: Secondary | ICD-10-CM | POA: Diagnosis not present

## 2021-04-19 DIAGNOSIS — M48061 Spinal stenosis, lumbar region without neurogenic claudication: Secondary | ICD-10-CM | POA: Diagnosis not present

## 2021-04-19 DIAGNOSIS — M47816 Spondylosis without myelopathy or radiculopathy, lumbar region: Secondary | ICD-10-CM | POA: Diagnosis not present

## 2021-04-27 DIAGNOSIS — M48062 Spinal stenosis, lumbar region with neurogenic claudication: Secondary | ICD-10-CM | POA: Diagnosis not present

## 2021-04-27 DIAGNOSIS — M5416 Radiculopathy, lumbar region: Secondary | ICD-10-CM | POA: Diagnosis not present

## 2021-05-13 ENCOUNTER — Other Ambulatory Visit: Payer: Self-pay | Admitting: Neurosurgery

## 2021-05-25 ENCOUNTER — Encounter (HOSPITAL_COMMUNITY): Payer: Self-pay

## 2021-05-25 NOTE — Pre-Procedure Instructions (Signed)
Surgical Instructions    Your procedure is scheduled on Monday, January 30th.  Report to [facility] Entrance "A" at 12:45 P.M., then check in with the Admitting office.  Call this number if you have problems the morning of surgery:  [phone]   If you have any questions prior to your surgery date call [phone]: Open Monday-Friday 8am-4pm    Remember:  Do not eat or drink after midnight the night before your surgery      Take these medicines the morning of surgery with A SIP OF WATER  amLODipine (NORVASC) gabapentin (NEURONTIN) metoprolol succinate (TOPROL-XL)  rosuvastatin (CRESTOR) sotalol (BETAPACE)   If needed: acetaminophen (TYLENOL)   Stop Eliquis 3 days prior to surgery (1/27) per order.  Follow your surgeon's instructions on when to stop Aspirin.  If no instructions were given by your surgeon then you will need to call the office to get those instructions.     As of today, STOP taking any Aspirin (unless otherwise instructed by your surgeon) Aleve, Naproxen, Ibuprofen, Motrin, Advil, Goody's, BC's, all herbal medications, fish oil, and all vitamins.                     Do NOT Smoke (Tobacco/Vaping) for 24 hours prior to your procedure.  If you use a CPAP at night, you may bring your mask/headgear for your overnight stay.   Contacts, glasses, piercing's, hearing aid's, dentures or partials may not be worn into surgery, please bring cases for these belongings.    For patients admitted to the hospital, discharge time will be determined by your treatment team.   Patients discharged the day of surgery will not be allowed to drive home, and someone needs to stay with them for 24 hours.  NO VISITORS WILL BE ALLOWED IN PRE-OP WHERE PATIENTS ARE PREPPED FOR SURGERY.  ONLY 1 SUPPORT PERSON MAY BE PRESENT IN THE WAITING ROOM WHILE YOU ARE IN SURGERY.  IF YOU ARE TO BE ADMITTED, ONCE YOU ARE IN YOUR ROOM YOU WILL BE ALLOWED TWO (2) VISITORS. (1) VISITOR MAY STAY  OVERNIGHT BUT MUST ARRIVE TO THE ROOM BY 8pm.  Minor children may have two parents present. Special consideration for safety and communication needs will be reviewed on a case by case basis.   Special instructions:   [facility]- Preparing For Surgery  Before surgery, you can play an important role. Because skin is not sterile, your skin needs to be as free of germs as possible. You can reduce the number of germs on your skin by washing with CHG (chlorahexidine gluconate) Soap before surgery.  CHG is an antiseptic cleaner which kills germs and bonds with the skin to continue killing germs even after washing.    Oral Hygiene is also important to reduce your risk of infection.  Remember - BRUSH YOUR TEETH THE MORNING OF SURGERY WITH YOUR REGULAR TOOTHPASTE  Please do not use if you have an allergy to CHG or antibacterial soaps. If your skin becomes reddened/irritated stop using the CHG.  Do not shave (including legs and underarms) for at least 48 hours prior to first CHG shower. It is OK to shave your face.  Please follow these instructions carefully.   Shower the NIGHT BEFORE SURGERY and the MORNING OF SURGERY  If you chose to wash your hair, wash your hair first as usual with your normal shampoo.  After you shampoo, rinse your hair and body thoroughly to remove the shampoo.  Use CHG Soap as  you would any other liquid soap. You can apply CHG directly to the skin and wash gently with a scrungie or a clean washcloth.   Apply the CHG Soap to your body ONLY FROM THE NECK DOWN.  Do not use on open wounds or open sores. Avoid contact with your eyes, ears, mouth and genitals (private parts). Wash Face and genitals (private parts)  with your normal soap.   Wash thoroughly, paying special attention to the area where your surgery will be performed.  Thoroughly rinse your body with warm water from the neck down.  DO NOT shower/wash with your normal soap after using and rinsing off the CHG  Soap.  Pat yourself dry with a CLEAN TOWEL.  Wear CLEAN PAJAMAS to bed the night before surgery  Place CLEAN SHEETS on your bed the night before your surgery  DO NOT SLEEP WITH PETS.   Day of Surgery: Shower with CHG soap. Do not wear jewelry Do not wear lotions, powders, colognes, or deodorant. Men may shave face and neck. Do not bring valuables to the hospital. [facility] is not responsible for any belongings or valuables. Wear Clean/Comfortable clothing the morning of surgery Remember to brush your teeth WITH YOUR REGULAR TOOTHPASTE.   Please read over the following fact sheets that you were given.   3 days prior to your procedure or After your COVID test   You are not required to quarantine however you are required to wear a well-fitting mask when you are out and around people not in your household. If your mask becomes wet or soiled, replace with a new one.   Wash your hands often with soap and water for 20 seconds or clean your hands with an alcohol-based hand sanitizer that contains at least 60% alcohol.   Do not share personal items.   Notify your provider:  o if you are in close contact with someone who has COVID  o or if you develop a fever of 100.4 or greater, sneezing, cough, sore throat, shortness of breath or body aches.

## 2021-05-26 ENCOUNTER — Other Ambulatory Visit: Payer: Self-pay

## 2021-05-26 ENCOUNTER — Encounter (HOSPITAL_COMMUNITY): Payer: Self-pay

## 2021-05-26 ENCOUNTER — Encounter (HOSPITAL_COMMUNITY)
Admission: RE | Admit: 2021-05-26 | Discharge: 2021-05-26 | Disposition: A | Discharge status: Home / Self Care | Payer: PPO | Source: Ambulatory Visit | Attending: Neurosurgery | Admitting: Neurosurgery

## 2021-05-26 ENCOUNTER — Other Ambulatory Visit: Payer: Self-pay | Admitting: Neurosurgery

## 2021-05-26 VITALS — BP 149/91 | HR 63 | Temp 97.6°F | Resp 18 | Wt 166.4 lb

## 2021-05-26 DIAGNOSIS — Z8673 Personal history of transient ischemic attack (TIA), and cerebral infarction without residual deficits: Secondary | ICD-10-CM | POA: Insufficient documentation

## 2021-05-26 DIAGNOSIS — Z7901 Long term (current) use of anticoagulants: Secondary | ICD-10-CM | POA: Insufficient documentation

## 2021-05-26 DIAGNOSIS — Z87891 Personal history of nicotine dependence: Secondary | ICD-10-CM | POA: Diagnosis not present

## 2021-05-26 DIAGNOSIS — M48061 Spinal stenosis, lumbar region without neurogenic claudication: Secondary | ICD-10-CM | POA: Diagnosis not present

## 2021-05-26 DIAGNOSIS — Z01818 Encounter for other preprocedural examination: Secondary | ICD-10-CM

## 2021-05-26 DIAGNOSIS — N189 Chronic kidney disease, unspecified: Secondary | ICD-10-CM | POA: Diagnosis not present

## 2021-05-26 DIAGNOSIS — I2510 Atherosclerotic heart disease of native coronary artery without angina pectoris: Secondary | ICD-10-CM | POA: Diagnosis not present

## 2021-05-26 DIAGNOSIS — Z01812 Encounter for preprocedural laboratory examination: Secondary | ICD-10-CM | POA: Insufficient documentation

## 2021-05-26 DIAGNOSIS — I129 Hypertensive chronic kidney disease with stage 1 through stage 4 chronic kidney disease, or unspecified chronic kidney disease: Secondary | ICD-10-CM | POA: Insufficient documentation

## 2021-05-26 DIAGNOSIS — I480 Paroxysmal atrial fibrillation: Secondary | ICD-10-CM | POA: Insufficient documentation

## 2021-05-26 DIAGNOSIS — I2582 Chronic total occlusion of coronary artery: Secondary | ICD-10-CM | POA: Diagnosis not present

## 2021-05-26 HISTORY — DX: Chronic kidney disease, unspecified: N18.9

## 2021-05-26 HISTORY — DX: Atherosclerotic heart disease of native coronary artery without angina pectoris: I25.10

## 2021-05-26 HISTORY — DX: Paroxysmal atrial fibrillation: I48.0

## 2021-05-26 HISTORY — DX: Essential (primary) hypertension: I10

## 2021-05-26 HISTORY — DX: Cerebral infarction, unspecified: I63.9

## 2021-05-26 LAB — BASIC METABOLIC PANEL
Anion gap: 5 (ref 5–15)
BUN: 24 mg/dL — ABNORMAL HIGH (ref 8–23)
CO2: 30 mmol/L (ref 22–32)
Calcium: 10.5 mg/dL — ABNORMAL HIGH (ref 8.9–10.3)
Chloride: 103 mmol/L (ref 98–111)
Creatinine, Ser: 1.49 mg/dL — ABNORMAL HIGH (ref 0.61–1.24)
GFR, Estimated: 51 mL/min — ABNORMAL LOW (ref >60)
Glucose, Bld: 100 mg/dL — ABNORMAL HIGH (ref 70–99)
Potassium: 4.4 mmol/L (ref 3.5–5.1)
Sodium: 138 mmol/L (ref 135–145)

## 2021-05-26 LAB — CBC
HCT: 46.2 % (ref 39.0–52.0)
Hemoglobin: 15.3 g/dL (ref 13.0–17.0)
MCH: 31.8 pg (ref 26.0–34.0)
MCHC: 33.1 g/dL (ref 30.0–36.0)
MCV: 96 fL (ref 80.0–100.0)
Platelets: 197 10*3/uL (ref 150–400)
RBC: 4.81 MIL/uL (ref 4.22–5.81)
RDW: 12.4 % (ref 11.5–15.5)
WBC: 5.9 10*3/uL (ref 4.0–10.5)
nRBC: 0 % (ref 0.0–0.2)

## 2021-05-26 LAB — SURGICAL PCR SCREEN
MRSA, PCR: NEGATIVE
Staphylococcus aureus: POSITIVE — AB

## 2021-05-26 NOTE — Progress Notes (Signed)
°   05/26/21 1128  OBSTRUCTIVE SLEEP APNEA  Have you ever been diagnosed with sleep apnea through a sleep study? No  Do you snore loudly (loud enough to be heard through closed doors)?  1  Do you often feel tired, fatigued, or sleepy during the daytime (such as falling asleep during driving or talking to someone)? 0  Has anyone observed you stop breathing during your sleep? 1  Do you have, or are you being treated for high blood pressure? 1  BMI more than 35 kg/m2? 0  Age > 50 (1-yes) 1  Neck circumference greater than:Male 16 inches or larger, Male 17inches or larger? 0  Male Gender (Yes=1) 1  Obstructive Sleep Apnea Score 5  Score 5 or greater  Results sent to PCP

## 2021-05-26 NOTE — Progress Notes (Signed)
PCP - Dr. Netty Starring Cardiologist - Dr. Ubaldo Glassing  PPM/ICD -  Device Orders -  Rep Notified -   Chest x-ray -  EKG - 10/20/20; requested from [facility] - 04/02/20 ECHO - 02/09/14 Cardiac Cath - 04/14/20  Sleep Study - patient denies; positive stop bang, forwarded to PCP CPAP -   Fasting Blood Sugar - n/a Checks Blood Sugar _____ times a day  Blood Thinner Instructions: hold Xarelto for 3 days Aspirin Instructions:   ERAS Protcol - NPO after midnight per order PRE-SURGERY Ensure or G2-   COVID TEST- ambulatory surgery   Anesthesia review:  yes, hx of CAD  Patient denies shortness of breath, fever, cough and chest pain at PAT appointment   All instructions explained to the patient, with a verbal understanding of the material. Patient agrees to go over the instructions while at home for a better understanding. Patient also instructed to wear a mask 3 days prior to surgery. The opportunity to ask questions was provided.

## 2021-05-27 NOTE — Progress Notes (Signed)
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  History includes former smoker, CAD (occluded RCA with left-to-right collaterals, 40% LAD, 60% CX, medical therapy 04/14/20), PAF, CVA (02/08/14), HTN, CKD.  OSA screening score was 5.  Medical clearance at "Low Risk" per Dion Body, MD with permission to hold Xarelto for 2 days prior to surgery, although given permission to hold for 72 hours for spinal injection last month. He reported instructions to hold Xarelto for 3 days prior to surgery.  Last cardiology visit with Dr. Ubaldo Glassing on 04/06/21. He was in SR at that time on sotalol, metoprolol, and apixaban. HTN well controlled. Overall doing well except for sciatica. Had pending MRI and ongoing evaluation by physiatry/neurosurgery. Continue medical therapy for CAD recommended. 6 month follow-up planned.   10/20/20 EKG requested from [facility], but is still pending.   Anesthesia team to evaluate on the day of surgery.   VS: BP (!) 149/91    Pulse 63    Temp 36.4 C (Oral)    Resp 18    Wt 75.5 kg    SpO2 100%    BMI 24.57 kg/m    PROVIDERS: Dion Body, MD is PCP [facility]) Bartholome Bill, MD is cardiologist [facility])   LABS: Labs reviewed: Acceptable for surgery. (all labs ordered are listed, but only abnormal results are displayed)  Labs Reviewed  SURGICAL PCR SCREEN - Abnormal; Notable for the following components:      Result Value   Staphylococcus aureus POSITIVE (*)    All other components within normal limits  BASIC METABOLIC PANEL - Abnormal; Notable for the following components:   Glucose, Bld 100 (*)    BUN 24 (*)     Creatinine, Ser 1.49 (*)    Calcium 10.5 (*)    GFR, Estimated 51 (*)    All other components within normal limits  CBC     IMAGES: MRI L-spine 04/06/21: IMPRESSION: Multilevel degenerative changes as detailed above. There is no high-grade canal stenosis. There is spurring associated with marked right facet arthropathy at L5-S1 potentially compressing the exiting L5 nerve root. Right subarticular recess narrowing is present at L4-L5. [See full report]   EKG: 10/20/20: Per Result Narrative in Care Everywhere: SB at 57 bpm. Tracing requested from [facility] Sinus bradycardia  When compared with ECG of 31-Mar-2020 11:50,  No significant change was found  I reviewed and concur with this report. Electronically signed [id number] MD, KEN ([id number]) on 10/26/2020 7:54:14 AM   CV: Cardiac cath 04/14/20: Prox RCA lesion is 100% stenosed. Prox LAD to Mid LAD lesion is 40% stenosed. Prox Cx lesion is 60% stenosed.   1.  Severe one-vessel coronary artery disease with occluded proximal right coronary artery with very well-developed left to right collaterals.  In addition, there seems to be some bridging collaterals.  There is some antegrade flow in the right coronary artery but I suspect it is due bridging collaterals and not a true channel.  In addition, there is 60% proximal left circumflex stenosis and diffuse 30 to 40% proximal/mid LAD disease.  The coronary arteries are moderately calcified overall.   2.  Left ventricular angiography was  not performed due to chronic kidney disease.  Normal LVEDP.   Recommendations: Given the very well developed left to right collaterals to the right coronary artery, I recommend maximizing medical therapy and antianginal treatment before considering revascularization.   Nuclear stress test 04/02/20 ([facility]): Impression: Limited exercise tolerance.  Stress EKG suggestive of possible inferior  ischemia.  EF mildly reduced at 47% with evidence of inferior  reversible  ischemia.  This is moderate.  Will need further evaluation to assess the  extent of his probable coronary disease.   Echo 02/09/14 (as outlined in 02/11/14 D/C Summary for CVA admission): "A 2D echocardiogram October 12 shows left ventricular ejection fraction is 65% to 70%. Normal global left ventricular systolic function. Mild mitral valve regurgitation."    US Carotid 02/08/14: IMPRESSION:  Minimal amount of bilateral atherosclerotic plaque, right  subjectively greater than left, not resulting in a hemodynamically  significant stenosis.     Past Medical History:  Diagnosis Date   Chronic kidney disease    Coronary artery disease    Hypertension    Paroxysmal A-fib (HCC)    Stroke [facility])     Past Surgical History:  Procedure Laterality Date   LEFT HEART CATH AND CORONARY ANGIOGRAPHY N/A 04/14/2020   Procedure: LEFT HEART CATH AND CORONARY ANGIOGRAPHY;  Surgeon: Wellington Hampshire, MD;  Location: [facility] CV LAB;  Service: Cardiovascular;  Laterality: N/A;    MEDICATIONS:  acetaminophen (TYLENOL) 500 MG tablet   amLODipine (NORVASC) 5 MG tablet   apixaban (ELIQUIS) 5 MG TABS tablet   aspirin EC 81 MG tablet   Coenzyme Q10-Vitamin E (QUNOL ULTRA COQ10 PO)   gabapentin (NEURONTIN) 300 MG capsule   Lidocaine-Glycerin (PREPARATION H EX)   metoprolol succinate (TOPROL-XL) 25 MG 24 hr tablet   Misc Natural Products (PROSTATE SUPPORT PO)   Multiple Vitamin (MULTIVITAMIN WITH MINERALS) TABS tablet   Omega-3 Fatty Acids (FISH OIL PO)   rosuvastatin (CRESTOR) 20 MG tablet   Saw Palmetto 450 MG CAPS   sotalol (BETAPACE) 160 MG tablet   No current facility-administered medications for this encounter.  Follow surgeon recommendations for perioperative ASA instructions.   Myra Gianotti, PA-C Surgical Short Stay/Anesthesiology [facility] Phone [phone] [facility] [place] Phone [phone] 05/27/2021 12:29 PM

## 2021-05-27 NOTE — Anesthesia Preprocedure Evaluation (Addendum)
Anesthesia Evaluation  Patient identified by MRN, date of birth, ID band Patient awake    Reviewed: Allergy & Precautions, NPO status , Patient's Chart, lab work & pertinent test results, reviewed documented beta blocker date and time   Airway Mallampati: II  TM Distance: >3 FB Neck ROM: Full    Dental  (+) Teeth Intact, Dental Advisory Given, Caps,    Pulmonary former smoker,    Pulmonary exam normal breath sounds clear to auscultation       Cardiovascular hypertension, Pt. on medications and Pt. on home beta blockers + CAD  Normal cardiovascular exam+ dysrhythmias Atrial Fibrillation  Rhythm:Regular Rate:Normal  Cardiac cath 04/14/20: ? Prox RCA lesion is 100% stenosed. ? Prox LAD to Mid LAD lesion is 40% stenosed. ? Prox Cx lesion is 60% stenosed.  1. Severe one-vessel coronary artery disease with occluded proximal right coronary artery with very well-developed left to right collaterals. In addition, there seems to be some bridging collaterals. There is some antegrade flow in the right coronary artery but I suspect it is due bridging collaterals and not a true channel. In addition, there is 60% proximal left circumflex stenosis and diffuse 30 to 40% proximal/mid LAD disease. The coronary arteries are moderately calcified overall.  2. Left ventricular angiography was not performed due to chronic kidney disease. Normal LVEDP.  Recommendations: Given the very well developed left to right collaterals to the right coronary artery, I recommend maximizing medical therapy and antianginal treatment before considering revascularization.  Nuclear stress test 04/02/20 ([facility]): Impression: Limited exercise tolerance. Stress EKG suggestive of possible inferior  ischemia. EF mildly reduced at 47% with evidence of inferior reversible  ischemia. This is moderate. Will need further evaluation to assess the  extent of his probable  coronary disease.     Neuro/Psych STENOSIS OF LATERAL RECESS OF LUMBAR SPINE CVA, No Residual Symptoms negative psych ROS   GI/Hepatic negative GI ROS, Neg liver ROS,   Endo/Other  negative endocrine ROS  Renal/GU Renal InsufficiencyRenal disease     Musculoskeletal negative musculoskeletal ROS (+)   Abdominal   Peds  Hematology  (+) Blood dyscrasia (Eliquis), ,   Anesthesia Other Findings Day of surgery medications reviewed with the patient.  Reproductive/Obstetrics                        Anesthesia Physical Anesthesia Plan  ASA: 3  Anesthesia Plan: General   Post-op Pain Management: Tylenol PO (pre-op) and Celebrex PO (pre-op)   Induction:   PONV Risk Score and Plan: 3 and Ondansetron and Midazolam  Airway Management Planned: Oral ETT  Additional Equipment:   Intra-op Plan:   Post-operative Plan: Extubation in OR  Informed Consent: I have reviewed the patients History and Physical, chart, labs and discussed the procedure including the risks, benefits and alternatives for the proposed anesthesia with the patient or authorized representative who has indicated his/her understanding and acceptance.     Dental advisory given  Plan Discussed with: Anesthesiologist  Anesthesia Plan Comments: (PAT note written 05/27/2021 by Myra Gianotti, PA-C. )      Anesthesia Quick Evaluation

## 2021-05-30 ENCOUNTER — Encounter (HOSPITAL_COMMUNITY)
Admission: RE | Disposition: A | Discharge status: Home / Self Care | Payer: Self-pay | Source: Home / Self Care | Attending: Neurosurgery

## 2021-05-30 ENCOUNTER — Other Ambulatory Visit: Payer: Self-pay

## 2021-05-30 ENCOUNTER — Ambulatory Visit (HOSPITAL_COMMUNITY): Payer: PPO

## 2021-05-30 ENCOUNTER — Encounter (HOSPITAL_COMMUNITY): Payer: Self-pay | Admitting: Neurosurgery

## 2021-05-30 ENCOUNTER — Ambulatory Visit (HOSPITAL_COMMUNITY): Payer: PPO | Admitting: Anesthesiology

## 2021-05-30 ENCOUNTER — Ambulatory Visit (HOSPITAL_COMMUNITY): Payer: PPO | Admitting: Vascular Surgery

## 2021-05-30 ENCOUNTER — Ambulatory Visit (HOSPITAL_COMMUNITY)
Admission: RE | Admit: 2021-05-30 | Discharge: 2021-05-30 | Disposition: A | Discharge status: Home / Self Care | Payer: PPO | Attending: Neurosurgery | Admitting: Neurosurgery

## 2021-05-30 DIAGNOSIS — Z87891 Personal history of nicotine dependence: Secondary | ICD-10-CM | POA: Insufficient documentation

## 2021-05-30 DIAGNOSIS — Z79899 Other long term (current) drug therapy: Secondary | ICD-10-CM | POA: Diagnosis not present

## 2021-05-30 DIAGNOSIS — M4186 Other forms of scoliosis, lumbar region: Secondary | ICD-10-CM | POA: Diagnosis not present

## 2021-05-30 DIAGNOSIS — M4180 Other forms of scoliosis, site unspecified: Secondary | ICD-10-CM | POA: Insufficient documentation

## 2021-05-30 DIAGNOSIS — N189 Chronic kidney disease, unspecified: Secondary | ICD-10-CM | POA: Insufficient documentation

## 2021-05-30 DIAGNOSIS — I10 Essential (primary) hypertension: Secondary | ICD-10-CM | POA: Diagnosis not present

## 2021-05-30 DIAGNOSIS — Z419 Encounter for procedure for purposes other than remedying health state, unspecified: Secondary | ICD-10-CM

## 2021-05-30 DIAGNOSIS — Z8673 Personal history of transient ischemic attack (TIA), and cerebral infarction without residual deficits: Secondary | ICD-10-CM | POA: Insufficient documentation

## 2021-05-30 DIAGNOSIS — I2510 Atherosclerotic heart disease of native coronary artery without angina pectoris: Secondary | ICD-10-CM | POA: Diagnosis not present

## 2021-05-30 DIAGNOSIS — M5416 Radiculopathy, lumbar region: Secondary | ICD-10-CM | POA: Diagnosis not present

## 2021-05-30 DIAGNOSIS — I480 Paroxysmal atrial fibrillation: Secondary | ICD-10-CM | POA: Insufficient documentation

## 2021-05-30 DIAGNOSIS — M48061 Spinal stenosis, lumbar region without neurogenic claudication: Secondary | ICD-10-CM | POA: Diagnosis not present

## 2021-05-30 DIAGNOSIS — N289 Disorder of kidney and ureter, unspecified: Secondary | ICD-10-CM | POA: Diagnosis not present

## 2021-05-30 DIAGNOSIS — Z981 Arthrodesis status: Secondary | ICD-10-CM | POA: Diagnosis not present

## 2021-05-30 DIAGNOSIS — I129 Hypertensive chronic kidney disease with stage 1 through stage 4 chronic kidney disease, or unspecified chronic kidney disease: Secondary | ICD-10-CM | POA: Insufficient documentation

## 2021-05-30 HISTORY — PX: LUMBAR LAMINECTOMY/DECOMPRESSION MICRODISCECTOMY: SHX5026

## 2021-05-30 SURGERY — LUMBAR LAMINECTOMY/DECOMPRESSION MICRODISCECTOMY 1 LEVEL
Anesthesia: General | Site: Spine Lumbar | Laterality: Right

## 2021-05-30 MED ORDER — MIDAZOLAM HCL 2 MG/2ML IJ SOLN
INTRAMUSCULAR | Status: AC
  Filled 2021-05-30: qty 2

## 2021-05-30 MED ORDER — FENTANYL CITRATE (PF) 250 MCG/5ML IJ SOLN
INTRAMUSCULAR | Status: DC | PRN
  Administered 2021-05-30: 100 ug via INTRAVENOUS

## 2021-05-30 MED ORDER — CYCLOBENZAPRINE HCL 10 MG PO TABS: 10.0000 mg | ORAL_TABLET | Freq: Three times a day (TID) | ORAL | 0 refills | Status: DC | PRN

## 2021-05-30 MED ORDER — ROSUVASTATIN CALCIUM 20 MG PO TABS
20.0000 mg | ORAL_TABLET | Freq: Every day | ORAL | Status: DC
  Administered 2021-05-30: 20 mg via ORAL
  Filled 2021-05-30: qty 1

## 2021-05-30 MED ORDER — ROCURONIUM BROMIDE 10 MG/ML (PF) SYRINGE
PREFILLED_SYRINGE | INTRAVENOUS | Status: DC | PRN
  Administered 2021-05-30: 60 mg via INTRAVENOUS

## 2021-05-30 MED ORDER — ACETAMINOPHEN 500 MG PO TABS: 1000.0000 mg | ORAL_TABLET | Freq: Once | ORAL | Status: AC

## 2021-05-30 MED ORDER — CHLORHEXIDINE GLUCONATE CLOTH 2 % EX PADS: 6.0000 | MEDICATED_PAD | Freq: Once | CUTANEOUS | Status: DC

## 2021-05-30 MED ORDER — BUPIVACAINE HCL (PF) 0.5 % IJ SOLN
INTRAMUSCULAR | Status: AC
  Filled 2021-05-30: qty 30

## 2021-05-30 MED ORDER — PROMETHAZINE HCL 25 MG/ML IJ SOLN: 6.2500 mg | INTRAMUSCULAR | Status: DC | PRN

## 2021-05-30 MED ORDER — ORAL CARE MOUTH RINSE: 15.0000 mL | Freq: Once | OROMUCOSAL | Status: AC

## 2021-05-30 MED ORDER — DOCUSATE SODIUM 100 MG PO CAPS: 100.0000 mg | ORAL_CAPSULE | Freq: Two times a day (BID) | ORAL | 0 refills | Status: DC

## 2021-05-30 MED ORDER — ACETAMINOPHEN 325 MG PO TABS: 650.0000 mg | ORAL_TABLET | ORAL | Status: DC | PRN

## 2021-05-30 MED ORDER — PHENOL 1.4 % MT LIQD: 1.0000 | OROMUCOSAL | Status: DC | PRN

## 2021-05-30 MED ORDER — ACETAMINOPHEN 500 MG PO TABS
ORAL_TABLET | ORAL | Status: AC
  Administered 2021-05-30: 1000 mg via ORAL
  Filled 2021-05-30: qty 2

## 2021-05-30 MED ORDER — SODIUM CHLORIDE 0.9 % IV SOLN: 250.0000 mL | INTRAVENOUS | Status: DC

## 2021-05-30 MED ORDER — CELECOXIB 200 MG PO CAPS
ORAL_CAPSULE | ORAL | Status: AC
  Administered 2021-05-30: 200 mg via ORAL
  Filled 2021-05-30: qty 1

## 2021-05-30 MED ORDER — THROMBIN 5000 UNITS EX SOLR
CUTANEOUS | Status: DC | PRN
  Administered 2021-05-30 (×2): 5000 [IU] via TOPICAL

## 2021-05-30 MED ORDER — THROMBIN 5000 UNITS EX SOLR: OROMUCOSAL | Status: DC | PRN

## 2021-05-30 MED ORDER — 0.9 % SODIUM CHLORIDE (POUR BTL) OPTIME
TOPICAL | Status: DC | PRN
  Administered 2021-05-30: 1000 mL

## 2021-05-30 MED ORDER — ONDANSETRON HCL 4 MG/2ML IJ SOLN
INTRAMUSCULAR | Status: DC | PRN
  Administered 2021-05-30: 4 mg via INTRAVENOUS

## 2021-05-30 MED ORDER — LACTATED RINGERS IV SOLN: INTRAVENOUS | Status: DC

## 2021-05-30 MED ORDER — METOPROLOL SUCCINATE ER 25 MG PO TB24: 25.0000 mg | ORAL_TABLET | Freq: Every morning | ORAL | Status: DC

## 2021-05-30 MED ORDER — MIDAZOLAM HCL 2 MG/2ML IJ SOLN
INTRAMUSCULAR | Status: DC | PRN
  Administered 2021-05-30: 2 mg via INTRAVENOUS

## 2021-05-30 MED ORDER — CHLORHEXIDINE GLUCONATE 0.12 % MT SOLN
15.0000 mL | Freq: Once | OROMUCOSAL | Status: AC
  Administered 2021-05-30: 15 mL via OROMUCOSAL

## 2021-05-30 MED ORDER — SOTALOL HCL 80 MG PO TABS
160.0000 mg | ORAL_TABLET | Freq: Every morning | ORAL | Status: DC
  Filled 2021-05-30: qty 2

## 2021-05-30 MED ORDER — SUGAMMADEX SODIUM 200 MG/2ML IV SOLN
INTRAVENOUS | Status: DC | PRN
  Administered 2021-05-30: 200 mg via INTRAVENOUS

## 2021-05-30 MED ORDER — FENTANYL CITRATE (PF) 100 MCG/2ML IJ SOLN: 25.0000 ug | INTRAMUSCULAR | Status: DC | PRN

## 2021-05-30 MED ORDER — ACETAMINOPHEN 650 MG RE SUPP: 650.0000 mg | RECTAL | Status: DC | PRN

## 2021-05-30 MED ORDER — ONDANSETRON HCL 4 MG/2ML IJ SOLN: 4.0000 mg | Freq: Four times a day (QID) | INTRAMUSCULAR | Status: DC | PRN

## 2021-05-30 MED ORDER — DEXAMETHASONE SODIUM PHOSPHATE 10 MG/ML IJ SOLN
INTRAMUSCULAR | Status: DC | PRN
  Administered 2021-05-30: 8 mg via INTRAVENOUS

## 2021-05-30 MED ORDER — SCOPOLAMINE 1 MG/3DAYS TD PT72: 1.0000 | MEDICATED_PATCH | TRANSDERMAL | Status: DC

## 2021-05-30 MED ORDER — ONDANSETRON HCL 4 MG PO TABS: 4.0000 mg | ORAL_TABLET | Freq: Four times a day (QID) | ORAL | Status: DC | PRN

## 2021-05-30 MED ORDER — CYCLOBENZAPRINE HCL 10 MG PO TABS: 10.0000 mg | ORAL_TABLET | Freq: Three times a day (TID) | ORAL | Status: DC | PRN

## 2021-05-30 MED ORDER — CHLORHEXIDINE GLUCONATE 0.12 % MT SOLN
OROMUCOSAL | Status: AC
  Filled 2021-05-30: qty 15

## 2021-05-30 MED ORDER — CELECOXIB 200 MG PO CAPS
200.0000 mg | ORAL_CAPSULE | Freq: Once | ORAL | Status: AC
Start: 2021-05-30 — End: 2021-05-30

## 2021-05-30 MED ORDER — BUPIVACAINE-EPINEPHRINE (PF) 0.5% -1:200000 IJ SOLN
INTRAMUSCULAR | Status: DC | PRN
  Administered 2021-05-30: 10 mL via PERINEURAL

## 2021-05-30 MED ORDER — SCOPOLAMINE 1 MG/3DAYS TD PT72
MEDICATED_PATCH | TRANSDERMAL | Status: AC
  Administered 2021-05-30: 1.5 mg via TRANSDERMAL
  Filled 2021-05-30: qty 1

## 2021-05-30 MED ORDER — BACITRACIN ZINC 500 UNIT/GM EX OINT
TOPICAL_OINTMENT | CUTANEOUS | Status: AC
  Filled 2021-05-30: qty 28.35

## 2021-05-30 MED ORDER — PROPOFOL 10 MG/ML IV BOLUS
INTRAVENOUS | Status: AC
  Filled 2021-05-30: qty 20

## 2021-05-30 MED ORDER — MENTHOL 3 MG MT LOZG: 1.0000 | LOZENGE | OROMUCOSAL | Status: DC | PRN

## 2021-05-30 MED ORDER — VANCOMYCIN HCL 750 MG/150ML IV SOLN
750.0000 mg | Freq: Once | INTRAVENOUS | Status: DC
Start: 2021-05-30 — End: 2021-05-31

## 2021-05-30 MED ORDER — BISACODYL 10 MG RE SUPP: 10.0000 mg | Freq: Every day | RECTAL | Status: DC | PRN

## 2021-05-30 MED ORDER — AMISULPRIDE (ANTIEMETIC) 5 MG/2ML IV SOLN: 10.0000 mg | Freq: Once | INTRAVENOUS | Status: DC | PRN

## 2021-05-30 MED ORDER — EPHEDRINE SULFATE (PRESSORS) 50 MG/ML IJ SOLN
INTRAMUSCULAR | Status: DC | PRN
  Administered 2021-05-30: 10 mg via INTRAVENOUS

## 2021-05-30 MED ORDER — PHENYLEPHRINE 40 MCG/ML (10ML) SYRINGE FOR IV PUSH (FOR BLOOD PRESSURE SUPPORT)
PREFILLED_SYRINGE | INTRAVENOUS | Status: DC | PRN
  Administered 2021-05-30: 80 ug via INTRAVENOUS
  Administered 2021-05-30: 120 ug via INTRAVENOUS

## 2021-05-30 MED ORDER — ACETAMINOPHEN 500 MG PO TABS
1000.0000 mg | ORAL_TABLET | Freq: Four times a day (QID) | ORAL | Status: DC
  Administered 2021-05-30: 1000 mg via ORAL
  Filled 2021-05-30: qty 2

## 2021-05-30 MED ORDER — SODIUM CHLORIDE 0.9% FLUSH: 3.0000 mL | INTRAVENOUS | Status: DC | PRN

## 2021-05-30 MED ORDER — CLINDAMYCIN PHOSPHATE 900 MG/50ML IV SOLN
900.0000 mg | Freq: Once | INTRAVENOUS | Status: AC
  Administered 2021-05-30: 900 mg via INTRAVENOUS
  Filled 2021-05-30: qty 50

## 2021-05-30 MED ORDER — VANCOMYCIN HCL IN DEXTROSE 1-5 GM/200ML-% IV SOLN: 1000.0000 mg | INTRAVENOUS | Status: DC

## 2021-05-30 MED ORDER — DOCUSATE SODIUM 100 MG PO CAPS: 100.0000 mg | ORAL_CAPSULE | Freq: Two times a day (BID) | ORAL | Status: DC

## 2021-05-30 MED ORDER — OXYCODONE-ACETAMINOPHEN 5-325 MG PO TABS: 1.0000 | ORAL_TABLET | ORAL | 0 refills | Status: AC | PRN

## 2021-05-30 MED ORDER — VANCOMYCIN HCL IN DEXTROSE 1-5 GM/200ML-% IV SOLN
INTRAVENOUS | Status: AC
  Filled 2021-05-30: qty 200

## 2021-05-30 MED ORDER — OXYCODONE HCL 5 MG PO TABS: 5.0000 mg | ORAL_TABLET | ORAL | Status: DC | PRN

## 2021-05-30 MED ORDER — PHENYLEPHRINE HCL-NACL 20-0.9 MG/250ML-% IV SOLN
INTRAVENOUS | Status: DC | PRN
  Administered 2021-05-30: 40 ug/min via INTRAVENOUS

## 2021-05-30 MED ORDER — OXYCODONE HCL 5 MG PO TABS: 10.0000 mg | ORAL_TABLET | ORAL | Status: DC | PRN

## 2021-05-30 MED ORDER — AMLODIPINE BESYLATE 5 MG PO TABS: 5.0000 mg | ORAL_TABLET | Freq: Every day | ORAL | Status: DC

## 2021-05-30 MED ORDER — MORPHINE SULFATE (PF) 4 MG/ML IV SOLN: 4.0000 mg | INTRAVENOUS | Status: DC | PRN

## 2021-05-30 MED ORDER — THROMBIN 5000 UNITS EX SOLR
CUTANEOUS | Status: AC
  Filled 2021-05-30: qty 5000

## 2021-05-30 MED ORDER — SODIUM CHLORIDE 0.9% FLUSH: 3.0000 mL | Freq: Two times a day (BID) | INTRAVENOUS | Status: DC

## 2021-05-30 MED ORDER — GABAPENTIN 300 MG PO CAPS: 300.0000 mg | ORAL_CAPSULE | Freq: Three times a day (TID) | ORAL | Status: DC

## 2021-05-30 MED ORDER — APIXABAN 5 MG PO TABS: 5.0000 mg | ORAL_TABLET | Freq: Two times a day (BID) | ORAL | Status: DC

## 2021-05-30 MED ORDER — LIDOCAINE 2% (20 MG/ML) 5 ML SYRINGE
INTRAMUSCULAR | Status: DC | PRN
  Administered 2021-05-30: 80 mg via INTRAVENOUS

## 2021-05-30 MED ORDER — HEMOSTATIC AGENTS (NO CHARGE) OPTIME
TOPICAL | Status: DC | PRN
  Administered 2021-05-30: 1 via TOPICAL

## 2021-05-30 MED ORDER — PROPOFOL 10 MG/ML IV BOLUS
INTRAVENOUS | Status: DC | PRN
  Administered 2021-05-30: 120 mg via INTRAVENOUS

## 2021-05-30 MED ORDER — BACITRACIN ZINC 500 UNIT/GM EX OINT
TOPICAL_OINTMENT | CUTANEOUS | Status: DC | PRN
Start: 2021-05-30 — End: 2021-05-30
  Administered 2021-05-30: 1 via TOPICAL

## 2021-05-30 MED ORDER — THROMBIN 5000 UNITS EX SOLR
CUTANEOUS | Status: AC
  Filled 2021-05-30: qty 10000

## 2021-05-30 MED ORDER — FENTANYL CITRATE (PF) 250 MCG/5ML IJ SOLN
INTRAMUSCULAR | Status: AC
  Filled 2021-05-30: qty 5

## 2021-05-30 SURGICAL SUPPLY — 46 items
BAG COUNTER SPONGE SURGICOUNT (BAG) ×2 IMPLANT
BAND RUBBER #18 3X1/16 STRL (MISCELLANEOUS) ×4 IMPLANT
BENZOIN TINCTURE PRP APPL 2/3 (GAUZE/BANDAGES/DRESSINGS) ×2 IMPLANT
BLADE CLIPPER SURG (BLADE) IMPLANT
BUR MATCHSTICK NEURO 3.0 LAGG (BURR) ×2 IMPLANT
BUR PRECISION FLUTE 6.0 (BURR) ×2 IMPLANT
CANISTER SUCT 3000ML PPV (MISCELLANEOUS) ×2 IMPLANT
CARTRIDGE OIL MAESTRO DRILL (MISCELLANEOUS) ×1 IMPLANT
CLSR STERI-STRIP ANTIMIC 1/2X4 (GAUZE/BANDAGES/DRESSINGS) ×1 IMPLANT
DIFFUSER DRILL AIR PNEUMATIC (MISCELLANEOUS) ×2 IMPLANT
DRAPE LAPAROTOMY 100X72X124 (DRAPES) ×2 IMPLANT
DRAPE MICROSCOPE LEICA (MISCELLANEOUS) ×2 IMPLANT
DRAPE SURG 17X23 STRL (DRAPES) ×8 IMPLANT
DRSG OPSITE POSTOP 4X6 (GAUZE/BANDAGES/DRESSINGS) ×1 IMPLANT
ELECT BLADE 4.0 EZ CLEAN MEGAD (MISCELLANEOUS) ×2
ELECT REM PT RETURN 9FT ADLT (ELECTROSURGICAL) ×2
ELECTRODE BLDE 4.0 EZ CLN MEGD (MISCELLANEOUS) ×1 IMPLANT
ELECTRODE REM PT RTRN 9FT ADLT (ELECTROSURGICAL) ×1 IMPLANT
GAUZE 4X4 16PLY ~~LOC~~+RFID DBL (SPONGE) IMPLANT
GAUZE SPONGE 4X4 12PLY STRL (GAUZE/BANDAGES/DRESSINGS) ×2 IMPLANT
GLOVE EXAM NITRILE XL STR (GLOVE) IMPLANT
GLOVE SURG ENC MOIS LTX SZ8 (GLOVE) ×2 IMPLANT
GLOVE SURG ENC MOIS LTX SZ8.5 (GLOVE) ×2 IMPLANT
GOWN STRL REUS W/ TWL LRG LVL3 (GOWN DISPOSABLE) IMPLANT
GOWN STRL REUS W/ TWL XL LVL3 (GOWN DISPOSABLE) ×1 IMPLANT
GOWN STRL REUS W/TWL 2XL LVL3 (GOWN DISPOSABLE) IMPLANT
GOWN STRL REUS W/TWL LRG LVL3 (GOWN DISPOSABLE)
GOWN STRL REUS W/TWL XL LVL3 (GOWN DISPOSABLE) ×1
KIT BASIN OR (CUSTOM PROCEDURE TRAY) ×2 IMPLANT
KIT TURNOVER KIT B (KITS) ×2 IMPLANT
NDL HYPO 21X1.5 SAFETY (NEEDLE) IMPLANT
NEEDLE HYPO 21X1.5 SAFETY (NEEDLE) IMPLANT
NEEDLE HYPO 22GX1.5 SAFETY (NEEDLE) ×2 IMPLANT
NS IRRIG 1000ML POUR BTL (IV SOLUTION) ×2 IMPLANT
OIL CARTRIDGE MAESTRO DRILL (MISCELLANEOUS) ×2
PACK LAMINECTOMY NEURO (CUSTOM PROCEDURE TRAY) ×2 IMPLANT
PAD ARMBOARD 7.5X6 YLW CONV (MISCELLANEOUS) ×9 IMPLANT
PATTIES SURGICAL .5 X1 (DISPOSABLE) IMPLANT
SPONGE SURGIFOAM ABS GEL SZ50 (HEMOSTASIS) ×2 IMPLANT
STRIP CLOSURE SKIN 1/2X4 (GAUZE/BANDAGES/DRESSINGS) ×2 IMPLANT
SUT VIC AB 1 CT1 18XBRD ANBCTR (SUTURE) ×1 IMPLANT
SUT VIC AB 1 CT1 8-18 (SUTURE) ×1
SUT VIC AB 2-0 CP2 18 (SUTURE) ×2 IMPLANT
TOWEL GREEN STERILE (TOWEL DISPOSABLE) ×2 IMPLANT
TOWEL GREEN STERILE FF (TOWEL DISPOSABLE) ×2 IMPLANT
WATER STERILE IRR 1000ML POUR (IV SOLUTION) ×2 IMPLANT

## 2021-05-30 NOTE — H&P (Signed)
Subjective: The patient is a 66 year old white male who has complained of back and right leg pain consistent with a lumbar radiculopathy.  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  I discussed the various treatment options with him.  He has decided proceed with surgery.  Past Medical History:  Diagnosis Date   Chronic kidney disease    Coronary artery disease    Hypertension    Paroxysmal A-fib (HCC)    Stroke [facility])     Past Surgical History:  Procedure Laterality Date   LEFT HEART CATH AND CORONARY ANGIOGRAPHY N/A 04/14/2020   Procedure: LEFT HEART CATH AND CORONARY ANGIOGRAPHY;  Surgeon: Wellington Hampshire, MD;  Location: [place] [facility];  Service: Cardiovascular;  Laterality: N/A;    Allergies  Allergen Reactions   Penicillins Rash and Other (See Comments)    CHILDHOOD REACTION.    Social History   Tobacco Use   Smoking status: Former    Types: Cigarettes   Smokeless tobacco: Former    Types: Chew  Substance Use Topics   Alcohol use: Not Currently    History reviewed. No pertinent family history. Prior to Admission medications   Medication Sig Start Date End Date Taking? Authorizing Provider  acetaminophen (TYLENOL) 500 MG tablet Take 500-1,000 mg by mouth every 6 (six) hours as needed (for pain).   Yes [provider]  amLODipine (NORVASC) 5 MG tablet Take 1 tablet (5 mg total) by mouth daily. 04/14/20 05/19/22 Yes Wellington Hampshire, MD  apixaban (ELIQUIS) 5 MG TABS tablet Take 5 mg by mouth 2 (two) times daily.   Yes [provider]  aspirin EC 81 MG tablet Take 81 mg by mouth in the morning. Swallow whole.   Yes [provider]  Coenzyme Q10-Vitamin E (QUNOL ULTRA COQ10 PO) Take 100 mg by mouth every evening.   Yes [provider]  gabapentin (NEURONTIN) 300 MG capsule Take 300 mg by mouth 3 (three) times daily.   Yes  [provider]  Lidocaine-Glycerin (PREPARATION H EX) Place 1 application rectally as needed (hemmorroids/pain.).   Yes [provider]  metoprolol succinate (TOPROL-XL) 25 MG 24 hr tablet Take 25 mg by mouth in the morning.   Yes [provider]  Misc Natural Products (PROSTATE SUPPORT PO) Take 500 mg by mouth in the morning. Pygeum   Yes [provider]  Multiple Vitamin (MULTIVITAMIN WITH MINERALS) TABS tablet Take 1 tablet by mouth daily. Centrum Silver   Yes [provider]  Omega-3 Fatty Acids (FISH OIL PO) Take 1,200 mg by mouth in the morning.   Yes [provider]  rosuvastatin (CRESTOR) 20 MG tablet Take 1 tablet (20 mg total) by mouth daily. 04/14/20 05/20/23 Yes Wellington Hampshire, MD  Saw Palmetto 450 MG CAPS Take 450 mg by mouth in the morning.   Yes [provider]  sotalol (BETAPACE) 160 MG tablet Take 160 mg by mouth in the morning.   Yes [provider]     Review of Systems  Positive ROS: As above  All other systems have been reviewed and were otherwise negative with the exception of those mentioned in the HPI and as above.  Objective: Vital signs in last 24 hours: Temp:  [97.6 F (36.4 C)] 97.6 F (36.4 C) (01/30 1258) Pulse Rate:  [61] 61 (01/30 1258) Resp:  [17] 17 (01/30 1258) BP: (192)/(87)  192/87 (01/30 1258) SpO2:  [99 %] 99 % (01/30 1258) Weight:  [73.5 kg] 73.5 kg (01/30 1258) Estimated body mass index is 23.92 kg/m as calculated from the following:   Height as of this encounter: 5\' 9"  (1.753 m).   Weight as of this encounter: 73.5 kg.   General Appearance: Alert Head: Normocephalic, without obvious abnormality, atraumatic Eyes: PERRL, conjunctiva/corneas clear, EOM's intact,    Ears: Normal  Throat: Normal  Neck: Supple, Back: unremarkable Lungs: Clear to auscultation bilaterally, respirations unlabored Heart: Regular rate and rhythm, no murmur, rub or gallop Abdomen: Soft,  non-tender Extremities: Extremities normal, atraumatic, no cyanosis or edema Skin: unremarkable  NEUROLOGIC:   Mental status: alert and oriented,Motor Exam - grossly normal Sensory Exam - grossly normal Reflexes:  Coordination - grossly normal Gait - grossly normal Balance - grossly normal Cranial Nerves: I: smell Not tested  II: visual acuity  OS: Normal  OD: Normal   II: visual fields Full to confrontation  II: pupils Equal, round, reactive to light  III,VII: ptosis None  III,IV,VI: extraocular muscles  Full ROM  V: mastication Normal  V: facial light touch sensation  Normal  V,VII: corneal reflex  Present  VII: facial muscle function - upper  Normal  VII: facial muscle function - lower Normal  VIII: hearing Not tested  IX: soft palate elevation  Normal  IX,X: gag reflex Present  XI: trapezius strength  5/5  XI: sternocleidomastoid strength 5/5  XI: neck flexion strength  5/5  XII: tongue strength  Normal    Data Review Lab Results  Component Value Date   WBC 5.9 05/26/2021   HGB 15.3 05/26/2021   HCT 46.2 05/26/2021   MCV 96.0 05/26/2021   PLT 197 05/26/2021   Lab Results  Component Value Date   NA 138 05/26/2021   K 4.4 05/26/2021   CL 103 05/26/2021   CO2 30 05/26/2021   BUN 24 (H) 05/26/2021   CREATININE 1.49 (H) 05/26/2021   GLUCOSE 100 (H) 05/26/2021   Lab Results  Component Value Date   INR 1.0 02/08/2014    Assessment/Plan: 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  I reviewed his imaging studies with him and pointed out the abnormalities.  We have discussed the various treatment options including surgery.  I described the surgical treatment option of a right L4-5 laminotomy/foraminotomies.  I have shown him surgical models.  I have given him a surgical pamphlet.  We have discussed the risk, benefits, alternatives, expected postop course, and  likelihood of achieving our goals with surgery.  I have answered all his questions.  He has decided proceed with surgery.   Ophelia Charter 05/30/2021 3:12 PM

## 2021-05-30 NOTE — Plan of Care (Signed)
Patient ready for discharge. No c/o pain at this time 0/10  surgical back pain. Not on any signs of acute distress. Discharge instruction given and verbalized understanding.

## 2021-05-30 NOTE — Discharge Instructions (Signed)
Wound Care Keep incision covered and dry for two days.    Do not put any creams, lotions, or ointments on incision. Leave steri-strips on back.  They will fall off by themselves.  Activity Walk each and every day, increasing distance each day. No lifting greater than 5 lbs.  Avoid excessive back motion. No driving for 2 weeks; may ride as a passenger locally.  Diet Resume your normal diet.   Return to Work Will be discussed at your follow up appointment.  Call Your Doctor If Any of These Occur Redness, drainage, or swelling at the wound.  Temperature greater than 101 degrees. Severe pain not relieved by pain medication. Incision starts to come apart.  Follow Up Appt Call today for appointment in 1-2 weeks [phone]) or for problems.

## 2021-05-30 NOTE — Anesthesia Procedure Notes (Signed)
Procedure Name: Intubation Date/Time: 05/30/2021 3:34 PM Performed by: Inda Coke, CRNA Pre-anesthesia Checklist: Patient identified, Emergency Drugs available, Suction available and Patient being monitored Patient Re-evaluated:Patient Re-evaluated prior to induction Oxygen Delivery Method: Circle System Utilized Preoxygenation: Pre-oxygenation with 100% oxygen Induction Type: IV induction Ventilation: Mask ventilation without difficulty and Oral airway inserted - appropriate to patient size Laryngoscope Size: Mac and 4 Grade View: Grade I Tube type: Oral Tube size: 7.5 mm Number of attempts: 1 Airway Equipment and Method: Stylet and Oral airway Placement Confirmation: ETT inserted through vocal cords under direct vision, positive ETCO2 and breath sounds checked- equal and bilateral Secured at: 23 cm Tube secured with: Tape Dental Injury: Teeth and Oropharynx as per pre-operative assessment

## 2021-05-30 NOTE — Discharge Summary (Signed)
Physician Discharge Summary     Providing Compassionate, Quality Care - Together   Patient ID: Eric Bryant MRN: [id number] DOB/AGE: 12/31/1955 66 y.o.  Admit date: 05/30/2021 Discharge date: 05/30/2021  Admission Diagnoses:  Discharge Diagnoses:  Principal Problem:   Spinal stenosis of lumbar region with radiculopathy   Discharged Condition: good  Hospital Course: Patient underwent a right L4-5 laminotomy/foraminotomies by Dr. Arnoldo Morale on 05/30/2021. He was admitted to 3C10 following recovery from anesthesia in the PACU. His postoperative course has been uncomplicated. He is ambulating independently and without difficulty. He is tolerating a normal diet. He is not having any bowel or bladder dysfunction. His pain is well-controlled with oral pain medication. He is ready for discharge home.   Consults: None  Significant Diagnostic Studies: radiology: DG Lumbar Spine 1 View  Result Date: 05/30/2021 CLINICAL DATA:  Localization for L4-5 laminotomy EXAM: LUMBAR SPINE - 1 VIEW COMPARISON:  04/06/2021 FINDINGS: Cross-table lateral view was obtained during the performance of the procedure. Surgical instrumentation is posterior to the last complete disc space, designated as L5/S1 on previous MRI. IMPRESSION: 1. Surgical instrumentation posterior to the L5/S1 disc space. Electronically Signed   By: Randa Ngo M.D.   On: 05/30/2021 16:16     Treatments: Surgery:  Right L4-5 laminotomy/foraminotomies using micro-dissection  Discharge Exam: Blood pressure (!) 160/86, pulse 62, temperature 97.6 F (36.4 C), resp. rate 18, height 5\' 9"  (1.753 m), weight 73.5 kg, SpO2 100 %.  Per report: Alert and oriented x 4 PERRLA CN II-XII grossly intact MAE, Strength and sensation intact Incision is covered with Honeycomb dressing and Steri Strips; Dressing is clean, dry, and intact   Disposition: Discharge disposition: 01-Home or Self Care        Allergies as of 05/30/2021        Reactions   Penicillins Rash, Other (See Comments)   CHILDHOOD REACTION.        Medication List     TAKE these medications    acetaminophen 500 MG tablet Commonly known as: TYLENOL Take 500-1,000 mg by mouth every 6 (six) hours as needed (for pain).   amLODipine 5 MG tablet Commonly known as: NORVASC Take 1 tablet (5 mg total) by mouth daily.   apixaban 5 MG Tabs tablet Commonly known as: ELIQUIS Take 1 tablet (5 mg total) by mouth 2 (two) times daily. **Restart on Wednesday, 2/1/20223** What changed: additional instructions   aspirin EC 81 MG tablet Take 81 mg by mouth in the morning. Swallow whole.   cyclobenzaprine 10 MG tablet Commonly known as: FLEXERIL Take 1 tablet (10 mg total) by mouth 3 (three) times daily as needed for muscle spasms.   docusate sodium 100 MG capsule Commonly known as: COLACE Take 1 capsule (100 mg total) by mouth 2 (two) times daily.   FISH OIL PO Take 1,200 mg by mouth in the morning.   gabapentin 300 MG capsule Commonly known as: NEURONTIN Take 300 mg by mouth 3 (three) times daily.   metoprolol succinate 25 MG 24 hr tablet Commonly known as: TOPROL-XL Take 25 mg by mouth in the morning.   multivitamin with minerals Tabs tablet Take 1 tablet by mouth daily. Centrum Silver   oxyCODONE-acetaminophen 5-325 MG tablet Commonly known as: Percocet Take 1 tablet by mouth every 4 (four) hours as needed for severe pain.   PREPARATION H EX Place 1 application rectally as needed (hemmorroids/pain.).   PROSTATE SUPPORT PO Take 500 mg by mouth in the morning. Pygeum   [place] Breath  ULTRA COQ10 PO Take 100 mg by mouth every evening.   rosuvastatin 20 MG tablet Commonly known as: Crestor Take 1 tablet (20 mg total) by mouth daily.   Saw Palmetto 450 MG Caps Take 450 mg by mouth in the morning.   sotalol 160 MG tablet Commonly known as: BETAPACE Take 160 mg by mouth in the morning.        Follow-up Information     Newman Pies,  MD Follow up.   Specialty: Neurosurgery Contact information: [address] [place] [address] [phone]                 Signed: Viona Gilmore, DNP, AGNP-C Nurse Practitioner  [facility] [place] [address], [place], [place] [address] P: [phone]     F: [phone]  05/30/2021, 7:52 PM

## 2021-05-30 NOTE — Op Note (Signed)
Brief history: The patient is a 66 year old white male who has complained of back and right leg pain consistent with lumbar radiculopathy.  He has failed medical management.  He was worked up with a lumbar MRI which demonstrated a degenerative scoliosis plus spinal stenosis most prominent at L4-5 on the right.  I discussed the various treatment options with him.  He has decided proceed with surgery.  Preoperative diagnosis: Adult degenerative scoliosis, lumbar spinal stenosis with radiculopathy, lumbago  Postoperative diagnosis: The same  Procedure: Right L4-5 laminotomy/foraminotomies using micro-dissection  Surgeon: Dr. Earle Gell  Asst.: Arnetha Massy, NP  Anesthesia: Gen. endotracheal  Estimated blood loss: Minimal  Drains: None  Complications: None  Description of procedure: The patient was brought to the operating room by the anesthesia team. General endotracheal anesthesia was induced. The patient was turned to the prone position on the Wilson frame. The patient's lumbosacral region was then prepared with Betadine scrub and Betadine solution. Sterile drapes were applied.  I then injected the area to be incised with Marcaine with epinephrine solution. I then used a scalpel to make a linear midline incision over the L4-5 intervertebral disc space. I then used electrocautery to perform a right sided subperiosteal dissection exposing the spinous process and lamina of L4 and L5. We obtained intraoperative radiograph to confirm our location. I then inserted the [facility] retractor for exposure.  We then brought the operative microscope into the field. Under its magnification and illumination we completed the microdissection. I used a high-speed drill to perform a laminotomy at L4-5 on the right. I then used a Kerrison punches to widen the laminotomy and removed the ligamentum flavum at L4-5 on the right. We then used microdissection to free up the thecal sac and the right L5 nerve root  from the epidural tissue. I then used a Kerrison punch to perform a foraminotomy at about the right L5 nerve root.  We inspected the intervertebral disc at L4-5 on the right.  It was bulging but not herniated.  We did not perform discectomy.  I then palpated along the ventral surface of the thecal sac and along exit route of the right L5 nerve root and noted that the neural structures were well decompressed. This completed the decompression.  We then obtained hemostasis using bipolar electrocautery. We irrigated the wound out with bacitracin solution. We then removed the retractor. We then reapproximated the patient's thoracolumbar fascia with interrupted #1 Vicryl suture. We then reapproximated the patient's subcutaneous tissue with interrupted 2-0 Vicryl suture. We then reapproximated patient's skin with Steri-Strips and benzoin. The was then coated with bacitracin ointment. The drapes were removed. The patient was subsequently returned to the supine position where they were extubated by the anesthesia team. The patient was then transported to the postanesthesia care unit in stable condition. All sponge instrument and needle counts were reportedly correct at the end of this case.

## 2021-05-30 NOTE — Transfer of Care (Signed)
Immediate Anesthesia Transfer of Care Note  Patient: KAIMANI CLAYSON  Procedure(s) Performed: Right, Lumbar four-five LAMINOTOMY, FORAMINOROMY (Right: Spine Lumbar)  Patient Location: PACU  Anesthesia Type:General  Level of Consciousness: awake, alert  and oriented  Airway & Oxygen Therapy: Patient Spontanous Breathing and Patient connected to nasal cannula oxygen  Post-op Assessment: Report given to RN and Post -op Vital signs reviewed and stable  Post vital signs: Reviewed and stable  Last Vitals:  Vitals Value Taken Time  BP 156/86 05/30/21 1701  Temp    Pulse 60 05/30/21 1703  Resp 14 05/30/21 1703  SpO2 100 % 05/30/21 1703  Vitals shown include unvalidated device data.  Last Pain:  Vitals:   05/30/21 1319  TempSrc:   PainSc: 4       Patients Stated Pain Goal: 3 (20/91/06 8166)  Complications: No notable events documented.

## 2021-05-30 NOTE — Progress Notes (Signed)
Pharmacy Antibiotic Note  Eric Bryant is a 66 y.o. male admitted on 05/30/2021 with surgical prophylaxis.  Pharmacy has been consulted for vancomycin dosing.  Confirmed no drain per RN and note. No vancomycin given during procedure per Epic. ClCr 48 ml/min.   Plan: Vancomycin 750mg  x 1  Height: 5\' 9"  (175.3 cm) Weight: 73.5 kg (162 lb) IBW/kg (Calculated) : 70.7  Temp (24hrs), Avg:97.5 F (36.4 C), Min:97.4 F (36.3 C), Max:97.6 F (36.4 C)  Recent Labs  Lab 05/26/21 1156  WBC 5.9  CREATININE 1.49*    Estimated Creatinine Clearance: 48.8 mL/min (A) (by C-G formula based on SCr of 1.49 mg/dL (H)).    Allergies  Allergen Reactions   Penicillins Rash and Other (See Comments)    CHILDHOOD REACTION.    Benetta Spar, PharmD, BCPS, BCCP Clinical Pharmacist  Please check AMION for all [place] phone numbers After 10:00 PM, call [place] [phone]

## 2021-05-31 ENCOUNTER — Encounter (HOSPITAL_COMMUNITY): Payer: Self-pay | Admitting: Neurosurgery

## 2021-05-31 NOTE — Anesthesia Postprocedure Evaluation (Signed)
Anesthesia Post Note  Patient: Eric Bryant  Procedure(s) Performed: Right, Lumbar four-five LAMINOTOMY, FORAMINOROMY (Right: Spine Lumbar)     Patient location during evaluation: PACU Anesthesia Type: General Level of consciousness: awake and alert Pain management: pain level controlled Vital Signs Assessment: post-procedure vital signs reviewed and stable Respiratory status: spontaneous breathing, nonlabored ventilation, respiratory function stable and patient connected to nasal cannula oxygen Cardiovascular status: blood pressure returned to baseline and stable Postop Assessment: no apparent nausea or vomiting Anesthetic complications: no   No notable events documented.  Last Vitals:  Vitals:   05/30/21 1800 05/30/21 1845  BP: (!) 157/81 (!) 160/86  Pulse: (!) 57 62  Resp: 13 18  Temp: 36.4 C   SpO2: 98% 100%            Effie Berkshire

## 2021-05-31 NOTE — Addendum Note (Signed)
Addendum  created 05/31/21 1320 by Effie Berkshire, MD   Attestation recorded in [place], [place] filed

## 2021-07-05 DIAGNOSIS — D2272 Melanocytic nevi of left lower limb, including hip: Secondary | ICD-10-CM | POA: Diagnosis not present

## 2021-07-05 DIAGNOSIS — D2261 Melanocytic nevi of right upper limb, including shoulder: Secondary | ICD-10-CM | POA: Diagnosis not present

## 2021-07-05 DIAGNOSIS — D2262 Melanocytic nevi of left upper limb, including shoulder: Secondary | ICD-10-CM | POA: Diagnosis not present

## 2021-07-05 DIAGNOSIS — D485 Neoplasm of uncertain behavior of skin: Secondary | ICD-10-CM | POA: Diagnosis not present

## 2021-07-05 DIAGNOSIS — D2271 Melanocytic nevi of right lower limb, including hip: Secondary | ICD-10-CM | POA: Diagnosis not present

## 2021-07-05 DIAGNOSIS — L570 Actinic keratosis: Secondary | ICD-10-CM | POA: Diagnosis not present

## 2021-07-05 DIAGNOSIS — D225 Melanocytic nevi of trunk: Secondary | ICD-10-CM | POA: Diagnosis not present

## 2021-07-05 DIAGNOSIS — C44319 Basal cell carcinoma of skin of other parts of face: Secondary | ICD-10-CM | POA: Diagnosis not present

## 2021-07-05 DIAGNOSIS — L821 Other seborrheic keratosis: Secondary | ICD-10-CM | POA: Diagnosis not present

## 2021-09-08 DIAGNOSIS — C44319 Basal cell carcinoma of skin of other parts of face: Secondary | ICD-10-CM | POA: Diagnosis not present

## 2021-09-27 DIAGNOSIS — M5416 Radiculopathy, lumbar region: Secondary | ICD-10-CM | POA: Diagnosis not present

## 2021-10-10 DIAGNOSIS — N1831 Chronic kidney disease, stage 3a: Secondary | ICD-10-CM | POA: Diagnosis not present

## 2021-10-10 DIAGNOSIS — I25118 Atherosclerotic heart disease of native coronary artery with other forms of angina pectoris: Secondary | ICD-10-CM | POA: Diagnosis not present

## 2021-10-10 DIAGNOSIS — I10 Essential (primary) hypertension: Secondary | ICD-10-CM | POA: Diagnosis not present

## 2021-10-10 DIAGNOSIS — I480 Paroxysmal atrial fibrillation: Secondary | ICD-10-CM | POA: Diagnosis not present

## 2021-10-10 DIAGNOSIS — Z8673 Personal history of transient ischemic attack (TIA), and cerebral infarction without residual deficits: Secondary | ICD-10-CM | POA: Diagnosis not present

## 2021-10-14 DIAGNOSIS — I10 Essential (primary) hypertension: Secondary | ICD-10-CM | POA: Diagnosis not present

## 2021-10-14 DIAGNOSIS — I480 Paroxysmal atrial fibrillation: Secondary | ICD-10-CM | POA: Diagnosis not present

## 2021-10-14 DIAGNOSIS — I25118 Atherosclerotic heart disease of native coronary artery with other forms of angina pectoris: Secondary | ICD-10-CM | POA: Diagnosis not present

## 2021-10-14 DIAGNOSIS — N1831 Chronic kidney disease, stage 3a: Secondary | ICD-10-CM | POA: Diagnosis not present

## 2021-10-17 DIAGNOSIS — N189 Chronic kidney disease, unspecified: Secondary | ICD-10-CM | POA: Insufficient documentation

## 2021-10-17 DIAGNOSIS — I10 Essential (primary) hypertension: Secondary | ICD-10-CM | POA: Insufficient documentation

## 2021-10-17 DIAGNOSIS — I2510 Atherosclerotic heart disease of native coronary artery without angina pectoris: Secondary | ICD-10-CM | POA: Insufficient documentation

## 2021-10-17 DIAGNOSIS — Z8673 Personal history of transient ischemic attack (TIA), and cerebral infarction without residual deficits: Secondary | ICD-10-CM | POA: Insufficient documentation

## 2021-10-17 DIAGNOSIS — I480 Paroxysmal atrial fibrillation: Secondary | ICD-10-CM | POA: Insufficient documentation

## 2021-10-17 DIAGNOSIS — E785 Hyperlipidemia, unspecified: Secondary | ICD-10-CM | POA: Insufficient documentation

## 2021-10-17 NOTE — Progress Notes (Unsigned)
Cardiology Office Note  Date:  10/18/2021   ID:  Eric Bryant, DOB 01-15-1956, MRN [id number]  PCP:  Dion Body, MD   Chief Complaint  Patient presents with   New Patient (Initial Visit)    Ref by Dr. Netty Starring for consultation of paroxysmal atrial fibrillation, coronary disease, hypertension. Former patient of Dr. Bethanne Ginger. Medications reviewed by the patient verbally.     HPI:  Eric Bryant is a 66 year old gentleman with past medical history of Coronary artery disease, occluded RCA  Paroxysmal atrial fibrillation Hypertension History of stroke Chronic kidney disease Referred by Dr. Richarda Overlie for consultation of his paroxysmal atrial fibrillation, coronary disease, hypertension  Previously followed by [facility] cardiology On last clinic visit December 2022 was in normal sinus rhythm per the notes  Notes indicating prior history of PAF, rhythm controlled on sotalol 160 twice daily and metoprolol and Eliquis  Other cardiac work-up reviewed left heart catheterization with Dr. Fletcher Anon on 04/14/20 revealed severe one-vessel coronary artery disease with an occluded proximal PRCS with left to right collaterals; 60% proximal left circumflex stenosis and diffuse 30-40% proximal/mid LAD stenosis. Medical management recommended.  He reports having 2-3 episodes of rapid heart rate a month, very symptomatic Only taking sotolol once a day, even previously tried 1/2 pill/month Does not like taking medications in general, reports compliance with his Eliquis Tachycardia episodes lasting 1 hr to 3-4 hrs  Very busy, works at [employer] work reviewed creatinine 1.5, followed by primary care Recent change off amlodipine  on to losartan  EKG personally reviewed by myself on todays visit Normal sinus rhythm with rate 52 bpm no significant ST-T wave changes   PMH:   has a past medical history of Chronic kidney disease, Coronary artery disease, Hypertension, Paroxysmal A-fib (Eric Bryant), and  Stroke (Eric Bryant).  PSH:    Past Surgical History:  Procedure Laterality Date   LEFT HEART CATH AND CORONARY ANGIOGRAPHY N/A 04/14/2020   Procedure: LEFT HEART CATH AND CORONARY ANGIOGRAPHY;  Surgeon: Wellington Hampshire, MD;  Location: [place] [facility];  Service: Cardiovascular;  Laterality: N/A;   LUMBAR LAMINECTOMY/DECOMPRESSION MICRODISCECTOMY Right 05/30/2021   Procedure: Right, Lumbar four-five LAMINOTOMY, FORAMINOROMY;  Surgeon: Newman Pies, MD;  Location: [place];  Service: Neurosurgery;  Laterality: Right;    Current Outpatient Medications  Medication Sig Dispense Refill   acetaminophen (TYLENOL) 500 MG tablet Take 500-1,000 mg by mouth every 6 (six) hours as needed (for pain).     apixaban (ELIQUIS) 5 MG TABS tablet Take 1 tablet (5 mg total) by mouth 2 (two) times daily. **Restart on Wednesday, 2/1/20223** 60 tablet    aspirin EC 81 MG tablet Take 81 mg by mouth in the morning. Swallow whole.     Coenzyme Q10-Vitamin E (QUNOL ULTRA COQ10 PO) Take 100 mg by mouth every evening.     docusate sodium (COLACE) 100 MG capsule Take 1 capsule (100 mg total) by mouth 2 (two) times daily. 10 capsule 0   Lidocaine-Glycerin (PREPARATION H EX) Place 1 application rectally as needed (hemmorroids/pain.).     losartan (COZAAR) 25 MG tablet Take 25 mg by mouth daily.     metoprolol succinate (TOPROL-XL) 25 MG 24 hr tablet Take 25 mg by mouth in the morning.     Misc Natural Products (PROSTATE SUPPORT PO) Take 500 mg by mouth in the morning. Pygeum     Omega-3 Fatty Acids (FISH OIL PO) Take 1,200 mg by mouth in the morning.     oxyCODONE-acetaminophen (PERCOCET) 5-325 MG  tablet Take 1 tablet by mouth every 4 (four) hours as needed for severe pain. 30 tablet 0   rosuvastatin (CRESTOR) 20 MG tablet Take 1 tablet (20 mg total) by mouth daily. 30 tablet 3   Saw Palmetto 450 MG CAPS Take 450 mg by mouth in the morning.     sotalol (BETAPACE) 160 MG tablet Take 160 mg by mouth in the morning.      cyclobenzaprine (FLEXERIL) 10 MG tablet Take 1 tablet (10 mg total) by mouth 3 (three) times daily as needed for muscle spasms. (Patient not taking: Reported on 10/18/2021) 30 tablet 0   gabapentin (NEURONTIN) 300 MG capsule Take 300 mg by mouth 3 (three) times daily. (Patient not taking: Reported on 10/18/2021)     Multiple Vitamin (MULTIVITAMIN WITH MINERALS) TABS tablet Take 1 tablet by mouth daily. Centrum Silver (Patient not taking: Reported on 10/18/2021)     No current facility-administered medications for this visit.     Allergies:   Penicillins   Social History:  The patient  reports that he has quit smoking. His smoking use included cigarettes. He has quit using smokeless tobacco.  His smokeless tobacco use included chew. He reports that he does not currently use alcohol. He reports that he does not use drugs.   Family History:   family history includes Breast cancer in his mother; Cancer in his mother; Heart attack (age of onset: 44) in his father; Heart disease in his father; Hyperlipidemia in his brother.    Review of Systems: Review of Systems  Constitutional: Negative.   HENT: Negative.    Respiratory: Negative.    Cardiovascular:  Positive for palpitations.  Gastrointestinal: Negative.   Musculoskeletal: Negative.   Neurological: Negative.   Psychiatric/Behavioral: Negative.    All other systems reviewed and are negative.    PHYSICAL EXAM: VS:  BP 140/80 (BP Location: Right Arm, Patient Position: Sitting, Cuff Size: Normal)   Pulse (!) 52   Ht '5\' 10"'$  (1.778 m)   Wt 168 lb (76.2 kg)   SpO2 98%   BMI 24.11 kg/m  , BMI Body mass index is 24.11 kg/m. GEN: Well nourished, well developed, in no acute distress HEENT: normal Neck: no JVD, carotid bruits, or masses Cardiac: RRR; no murmurs, rubs, or gallops,no edema  Respiratory:  clear to auscultation bilaterally, normal work of breathing GI: soft, nontender, nondistended, + BS MS: no deformity or atrophy Skin: warm  and dry, no rash Neuro:  Strength and sensation are intact Psych: euthymic mood, full affect   Recent Labs: 05/26/2021: BUN 24; Creatinine, Ser 1.49; Hemoglobin 15.3; Platelets 197; Potassium 4.4; Sodium 138    Lipid Panel No results found for: "CHOL", "HDL", "LDLCALC", "TRIG"    Wt Readings from Last 3 Encounters:  10/18/21 168 lb (76.2 kg)  05/30/21 162 lb (73.5 kg)  05/26/21 166 lb 6.4 oz (75.5 kg)     ASSESSMENT AND PLAN:  Problem List Items Addressed This Visit       Cardiology Problems   CAD (coronary artery disease)   Relevant Medications   losartan (COZAAR) 25 MG tablet   Other Relevant Orders   EKG 12-Lead   Paroxysmal atrial fibrillation (HCC)   Relevant Medications   losartan (COZAAR) 25 MG tablet   Other Relevant Orders   EKG 12-Lead   Hyperlipidemia   Relevant Medications   losartan (COZAAR) 25 MG tablet   Other Relevant Orders   EKG 12-Lead   Essential hypertension   Relevant Medications  losartan (COZAAR) 25 MG tablet     Other   History of stroke   Chronic kidney disease   Atrial fibrillation, paroxysmal He reports there has never been a clear diagnosis of atrial fibrillation it was just assumed he had atrial fibrillation based on prior stroke Discussed first treatment options for his arrhythmia To start we have recommended a Zio monitor to help identify his arrhythmia No change to his sotalol which she is taking once a day and metoprolol which he is taking daily with Eliquis After identification of his arrhythmia, could consider ablation if it is atrial fibrillation or alternate antiarrhythmic as he continues to have breakthrough and is symptomatic  Coronary disease with stable angina Cholesterol at goal Denies anginal symptoms No further ischemic work-up at this time  Hyperlipidemia Cholesterol is at goal on the current lipid regimen. No changes to the medications were made.  Essential hypertension Recommend he closely monitor blood  pressure at home and call us with some numbers Mildly elevated on today's visit    Total encounter time more than 60 minutes  Greater than 50% was spent in counseling and coordination of care with the patient    Signed, Esmond Plants, M.D., Ph.D. [place], [place]

## 2021-10-18 ENCOUNTER — Ambulatory Visit: Payer: PPO | Admitting: Cardiovascular Disease

## 2021-10-18 ENCOUNTER — Encounter: Payer: Self-pay | Admitting: Cardiovascular Disease

## 2021-10-18 ENCOUNTER — Ambulatory Visit (INDEPENDENT_AMBULATORY_CARE_PROVIDER_SITE_OTHER): Payer: PPO

## 2021-10-18 DIAGNOSIS — I480 Paroxysmal atrial fibrillation: Secondary | ICD-10-CM | POA: Diagnosis not present

## 2021-10-18 DIAGNOSIS — I25118 Atherosclerotic heart disease of native coronary artery with other forms of angina pectoris: Secondary | ICD-10-CM | POA: Diagnosis not present

## 2021-10-18 DIAGNOSIS — E782 Mixed hyperlipidemia: Secondary | ICD-10-CM | POA: Diagnosis not present

## 2021-10-18 DIAGNOSIS — I10 Essential (primary) hypertension: Secondary | ICD-10-CM | POA: Diagnosis not present

## 2021-10-18 DIAGNOSIS — N189 Chronic kidney disease, unspecified: Secondary | ICD-10-CM | POA: Diagnosis not present

## 2021-10-18 DIAGNOSIS — Z8673 Personal history of transient ischemic attack (TIA), and cerebral infarction without residual deficits: Secondary | ICD-10-CM | POA: Diagnosis not present

## 2021-10-18 NOTE — Patient Instructions (Addendum)
KardiaMobile 1-Lead Personal EKG Monitor -  Record EKGs at Home   Medication Instructions:  No changes  If you need a refill on your cardiac medications before your next appointment, please call your pharmacy.   Lab work: No new labs needed  Testing/Procedures:  Your provider has ordered a heart monitor to wear for 14 days. This will be mailed to your home with instructions on placement. Once you have finished the time frame requested, you will return monitor in box provided.     Follow-Up: At [facility], you and your health needs are our priority.  As part of our continuing mission to provide you with exceptional heart care, we have created designated Provider Care Teams.  These Care Teams include your primary Cardiologist (physician) and Advanced Practice Providers (APPs -  Physician Assistants and Nurse Practitioners) who all work together to provide you with the care you need, when you need it.  You will need a follow up appointment in 3 months  Providers on your designated Care Team:   Murray Hodgkins, NP Christell Faith, PA-C Cadence Kathlen Mody, [place]  COVID-19 Vaccine Information can be found at: [email or web address] For questions related to vaccine distribution or appointments, please email [email or web address] or call [phone].

## 2021-10-21 DIAGNOSIS — I480 Paroxysmal atrial fibrillation: Secondary | ICD-10-CM | POA: Diagnosis not present

## 2021-11-10 DIAGNOSIS — I480 Paroxysmal atrial fibrillation: Secondary | ICD-10-CM | POA: Diagnosis not present

## 2021-11-15 ENCOUNTER — Telehealth: Payer: Self-pay | Admitting: Emergency Medicine

## 2021-11-15 DIAGNOSIS — I480 Paroxysmal atrial fibrillation: Secondary | ICD-10-CM

## 2021-11-15 NOTE — Telephone Encounter (Signed)
Called patient, went over results with patient.   Asked patient if he would like to meet with EP to discuss ablation and patient stated that he would like to discuss maybe trying another medication before jumping into ablation, as he had been on the same medications for years.   I advised patient that EP would be a good provider to discuss possible antiarrhythmics with. Pt would like to meet with EP to discuss further options going forward.   Advised patient that I would send a message to scheduling to get him scheduled with EP after placing referral.   Pt verbalized understanding of everything discussed and voiced appreciation for the call.

## 2021-11-15 NOTE — Telephone Encounter (Signed)
-----   Message from Minna Merritts, MD sent at 11/14/2021  8:18 PM EDT ----- Event Monitor Atrial fibrillation recorded, 11% of the time, antiarrhythmic medication not holding normal rhythm.Longest afib run 16 hours. Would he like to meet with EP to discuss ablation? We can set up with Dr. Quentin Ore for discussion

## 2021-11-15 NOTE — Telephone Encounter (Signed)
Attempted to schedule consult Dr. Quentin Ore .  No ans no vm.

## 2021-11-22 NOTE — Progress Notes (Unsigned)
Electrophysiology Office Note:    Date:  11/23/2021   ID:  Eric Bryant, DOB 1956-04-07, MRN [id number]  PCP:  Dion Body, MD  [facility] Cardiologist:  None  [facility] Electrophysiologist:  Vickie Epley, MD   Referring MD: Minna Merritts, MD   Chief Complaint: Atrial fibrillation  History of Present Illness:    Eric Bryant is a 66 y.o. male who presents for an evaluation of atrial fibrillation at the request of Dr. Rockey Situ. Their medical history includes coronary artery disease, paroxysmal atrial fibrillation, hypertension, stroke, chronic kidney disease.  The patient last saw Dr. Rockey Situ October 18, 2021.  He had previously been prescribed sotalol 160 mg by mouth twice daily along with his Eliquis.  At the appointment with Dr. Rockey Situ he reported 2-3 episodes of rapidly conducted atrial fibrillation per month.  Episodes are highly symptomatic.  He takes Eliquis reliably.  He is with his wife today in clinic.  He snores at night.  He has apneic episodes at night.  He feels very poorly when he is in atrial fibrillation.  He has been on sotalol for 7 years.  He was started by Dr. Ubaldo Glassing at [place] clinic.     Past Medical History:  Diagnosis Date   Chronic kidney disease    Coronary artery disease    Hypertension    Paroxysmal A-fib (HCC)    Stroke [facility])     Past Surgical History:  Procedure Laterality Date   LEFT HEART CATH AND CORONARY ANGIOGRAPHY N/A 04/14/2020   Procedure: LEFT HEART CATH AND CORONARY ANGIOGRAPHY;  Surgeon: Wellington Hampshire, MD;  Location: [place] [facility];  Service: Cardiovascular;  Laterality: N/A;   LUMBAR LAMINECTOMY/DECOMPRESSION MICRODISCECTOMY Right 05/30/2021   Procedure: Right, Lumbar four-five LAMINOTOMY, FORAMINOROMY;  Surgeon: Newman Pies, MD;  Location: [place];  Service: Neurosurgery;  Laterality: Right;    Current Medications: Current Meds  Medication Sig   acetaminophen (TYLENOL) 500 MG tablet Take 500-1,000 mg  by mouth every 6 (six) hours as needed (for pain).   apixaban (ELIQUIS) 5 MG TABS tablet Take 1 tablet (5 mg total) by mouth 2 (two) times daily. **Restart on Wednesday, 2/1/20223**   aspirin EC 81 MG tablet Take 81 mg by mouth in the morning. Swallow whole.   Coenzyme Q10-Vitamin E (QUNOL ULTRA COQ10 PO) Take 100 mg by mouth every evening.   docusate sodium (COLACE) 100 MG capsule Take 1 capsule (100 mg total) by mouth 2 (two) times daily.   Lidocaine-Glycerin (PREPARATION H EX) Place 1 application rectally as needed (hemmorroids/pain.).   losartan (COZAAR) 25 MG tablet Take 25 mg by mouth daily.   metoprolol succinate (TOPROL-XL) 25 MG 24 hr tablet Take 25 mg by mouth in the morning.   Misc Natural Products (PROSTATE SUPPORT PO) Take 500 mg by mouth in the morning. Pygeum   Omega-3 Fatty Acids (FISH OIL PO) Take 1,200 mg by mouth in the morning.   oxyCODONE-acetaminophen (PERCOCET) 5-325 MG tablet Take 1 tablet by mouth every 4 (four) hours as needed for severe pain.   rosuvastatin (CRESTOR) 20 MG tablet Take 1 tablet (20 mg total) by mouth daily.   Saw Palmetto 450 MG CAPS Take 450 mg by mouth in the morning.   sotalol (BETAPACE) 160 MG tablet Take 160 mg by mouth in the morning.     Allergies:   Penicillins   Social History   Socioeconomic History   Marital status: Married    Spouse name: Not on  file   Number of children: Not on file   Years of education: Not on file   Highest education level: Not on file  Occupational History   Not on file  Tobacco Use   Smoking status: Former    Types: Cigarettes   Smokeless tobacco: Former    Types: Nurse, children's Use: Never used  Substance and Sexual Activity   Alcohol use: Not Currently   Drug use: Never   Sexual activity: Not on file  Other Topics Concern   Not on file  Social History Narrative   Not on file   Social Determinants of Health   Financial Resource Strain: Not on file  Food Insecurity: Not on file   Transportation Needs: Not on file  Physical Activity: Not on file  Stress: Not on file  Social Connections: Not on file     Family History: The patient's family history includes Breast cancer in his mother; Cancer in his mother; Heart attack (age of onset: 61) in his father; Heart disease in his father; Hyperlipidemia in his brother.  ROS:   Please see the history of present illness.    All other systems reviewed and are negative.  EKGs/Labs/Other Studies Reviewed:    The following studies were reviewed today:  November 14, 2021 ZIO monitor personally reviewed 11% atrial fibrillation burden with a ventricular rate of 54 to 159 bpm.  Longest episode lasted 16 hours and 21 minutes.  Symptom triggered episodes correspond to atrial fibrillation.  Rare ectopy.  April 14, 2020 left heart catheterization personally reviewed  Prox RCA lesion is 100% stenosed. Prox LAD to Mid LAD lesion is 40% stenosed. Prox Cx lesion is 60% stenosed.  1.  Severe one-vessel coronary artery disease with occluded proximal right coronary artery with very well-developed left to right collaterals.  In addition, there seems to be some bridging collaterals.  There is some antegrade flow in the right coronary artery but I suspect it is due bridging collaterals and not a true channel.  In addition, there is 60% proximal left circumflex stenosis and diffuse 30 to 40% proximal/mid LAD disease.  The coronary arteries are moderately calcified overall.  2.  Left ventricular angiography was not performed due to chronic kidney disease.  Normal LVEDP.    EKG:  The ekg ordered today demonstrates sinus rhythm.   Recent Labs: 05/26/2021: BUN 24; Creatinine, Ser 1.49; Hemoglobin 15.3; Platelets 197; Potassium 4.4; Sodium 138  Recent Lipid Panel No results found for: "CHOL", "TRIG", "HDL", "CHOLHDL", "VLDL", "LDLCALC", "LDLDIRECT"  Physical Exam:    VS:  BP (!) 148/82   Pulse (!) 54   Ht '5\' 10"'$  (1.778 m)   Wt 164 lb (74.4  kg)   BMI 23.53 kg/m     Wt Readings from Last 3 Encounters:  11/23/21 164 lb (74.4 kg)  10/18/21 168 lb (76.2 kg)  05/30/21 162 lb (73.5 kg)     GEN:  Well nourished, well developed in no acute distress HEENT: Normal NECK: No JVD; No carotid bruits LYMPHATICS: No lymphadenopathy CARDIAC: RRR, no murmurs, rubs, gallops RESPIRATORY:  Clear to auscultation without rales, wheezing or rhonchi  ABDOMEN: Soft, non-tender, non-distended MUSCULOSKELETAL:  No edema; No deformity  SKIN: Warm and dry NEUROLOGIC:  Alert and oriented x 3 PSYCHIATRIC:  Normal affect       ASSESSMENT:    1. Paroxysmal atrial fibrillation (HCC)   2. Coronary artery disease of native artery of native heart with stable angina pectoris (  [place])   3. Encounter for long-term (current) use of high-risk medication    PLAN:    In order of problems listed above:  #Paroxysmal atrial fibrillation Occurring despite use of sotalol.  We discussed treatment options for his atrial fibrillation including continued antiarrhythmic drugs, alternative antiarrhythmic drugs and catheter ablation.  I do think he would be a candidate for catheter ablation.  Ablation would also allow [place] to avoid sotalol use which is significantly blunted his heart rate variability and chronotropy which is likely contributing to some of his baseline fatigue.  I am concerned that sleep apnea may be contributing to the burden of atrial fibrillation and this should also be addressed.  He should continue Eliquis for stroke prophylaxis.  QTc today is 400 ms on sotalol.  Discussed treatment options today for his AF including antiarrhythmic drug therapy and ablation. Discussed risks, recovery and likelihood of success. Discussed potential need for repeat ablation procedures and antiarrhythmic drugs after an initial ablation.   Risk, benefits, and alternatives to EP study and radiofrequency ablation for afib were also discussed in detail today. These risks  include but are not limited to stroke, bleeding, vascular damage, tamponade, perforation, damage to the esophagus, lungs, and other structures, pulmonary vein stenosis, worsening renal function, and death. The patient understands these risks and will let us know if he wants to schedule. Carto, ICE, anesthesia would be requested for the procedure.  Will also obtain CT PV protocol prior to the procedure to exclude LAA thrombus and further evaluate atrial anatomy.  #Suspected sleep apnea Order home sleep study today  #Coronary artery disease No ischemic symptoms Continue statin and aspirin   Follow-up in 4 months with APP.    Medication Adjustments/Labs and Tests Ordered: Current medicines are reviewed at length with the patient today.  Concerns regarding medicines are outlined above.  No orders of the defined types were placed in this encounter.  No orders of the defined types were placed in this encounter.    Signed, Hilton Cork. Quentin Ore, MD, [facility], [facility] 11/23/2021 10:09 AM    Electrophysiology [facility]

## 2021-11-23 ENCOUNTER — Encounter: Payer: Self-pay | Admitting: Cardiology

## 2021-11-23 ENCOUNTER — Ambulatory Visit: Payer: PPO | Admitting: Cardiology

## 2021-11-23 VITALS — BP 148/82 | HR 54 | Ht 70.0 in | Wt 164.0 lb

## 2021-11-23 DIAGNOSIS — I25118 Atherosclerotic heart disease of native coronary artery with other forms of angina pectoris: Secondary | ICD-10-CM | POA: Diagnosis not present

## 2021-11-23 DIAGNOSIS — R5383 Other fatigue: Secondary | ICD-10-CM | POA: Diagnosis not present

## 2021-11-23 DIAGNOSIS — I480 Paroxysmal atrial fibrillation: Secondary | ICD-10-CM

## 2021-11-23 DIAGNOSIS — Z79899 Other long term (current) drug therapy: Secondary | ICD-10-CM

## 2021-11-23 DIAGNOSIS — R0683 Snoring: Secondary | ICD-10-CM | POA: Diagnosis not present

## 2021-11-23 NOTE — Patient Instructions (Addendum)
Medications: Your physician recommends that you continue on your current medications as directed. Please refer to the Current Medication list given to you today. *If you need a refill on your cardiac medications before your next appointment, please call your pharmacy*  Lab Work: None. If you have labs (blood work) drawn today and your tests are completely normal, you will receive your results only by: [place] (if you have MyChart) OR A paper copy in the mail If you have any lab test that is abnormal or we need to change your treatment, we will call you to review the results.  Testing/Procedures: Your physician has recommended that you have a sleep study. This test records several body functions during sleep, including: brain activity, eye movement, oxygen and carbon dioxide blood levels, heart rate and rhythm, breathing rate and rhythm, the flow of air through your mouth and nose, snoring, body muscle movements, and chest and belly movement.   Follow-Up: At [facility], you and your health needs are our priority.  As part of our continuing mission to provide you with exceptional heart care, we have created designated Provider Care Teams.  These Care Teams include your primary Cardiologist (physician) and Advanced Practice Providers (APPs -  Physician Assistants and Nurse Practitioners) who all work together to provide you with the care you need, when you need it.  Your physician wants you to follow-up in: 4 months with one of the following Advanced Practice Providers on your designated Care Team:    Ignacia Bayley, NP Christell Faith PA Cadence Kathlen Mody PA  As needed with Dr. Quentin Ore. Please call the office if you wish to move forward with Ablation.   We recommend signing up for the patient portal called "MyChart".  Sign up information is provided on this After Visit Summary.  MyChart is used to connect with patients for Virtual Visits (Telemedicine).  Patients are able to view lab/test  results, encounter notes, upcoming appointments, etc.  Non-urgent messages can be sent to your provider as well.   To learn more about what you can do with MyChart, go to [email or web address].    Any Other Special Instructions Will Be Listed Below (If Applicable).

## 2021-11-29 ENCOUNTER — Telehealth: Payer: Self-pay | Admitting: *Deleted

## 2021-11-29 ENCOUNTER — Telehealth: Payer: Self-pay | Admitting: Cardiology

## 2021-11-29 ENCOUNTER — Other Ambulatory Visit: Payer: Self-pay | Admitting: Cardiovascular Disease

## 2021-11-29 DIAGNOSIS — I480 Paroxysmal atrial fibrillation: Secondary | ICD-10-CM

## 2021-11-29 NOTE — Telephone Encounter (Signed)
Eliquis '5mg'$  refill request received. Patient is 66 years old, weight-74.4kg, Crea-1.49 on 05/26/2021, Diagnosis-Afib, and last seen by Dr. Quentin Ore on 11/23/2021. Dose is appropriate based on dosing criteria. Will send in refill to requested pharmacy.

## 2021-11-29 NOTE — Telephone Encounter (Signed)
Prior Authorization for [facility] sent to [employer] via web portal. APPROVED-AUTH#[id number]-- 02/22/22. Marland Kitchen

## 2021-11-29 NOTE — Telephone Encounter (Signed)
Wife called stating patient was sent home with a home sleep kit and was waiting to hear back from someone regarding whether the insurance gave their approval for him to use the kit.

## 2021-11-29 NOTE — Telephone Encounter (Signed)
Refill request

## 2021-11-30 NOTE — Telephone Encounter (Signed)
See other encounter.

## 2021-11-30 NOTE — Telephone Encounter (Signed)
Gave access code for sleep study.

## 2021-12-10 ENCOUNTER — Encounter (INDEPENDENT_AMBULATORY_CARE_PROVIDER_SITE_OTHER): Payer: PPO | Admitting: Cardiology

## 2021-12-10 DIAGNOSIS — G4733 Obstructive sleep apnea (adult) (pediatric): Secondary | ICD-10-CM

## 2021-12-11 NOTE — Procedures (Signed)
   SLEEP STUDY REPORT Patient Information Study Date: 12/10/21 Patient Name: Eric Bryant Patient ID: [id number] Birth Date: 12-Nov-2055 Age: 66 Gender: Male BMI: 23.4 (W=163 lb, H=5' 10'') Referring Physician: Lars Mage, MD  TEST DESCRIPTION: Home sleep apnea testing was completed using the WatchPat, a Type 1 device, utilizing peripheral arterial tonometry (PAT), chest movement, actigraphy, pulse oximetry, pulse rate, body position and snore. AHI was calculated with apnea and hypopnea using valid sleep time as the denominator. RDI includes apneas, hypopneas, and RERAs. The data acquired and the scoring of sleep and all associated events were performed in accordance with the recommended standards and specifications as outlined in the AASM Manual for the Scoring of Sleep and Associated Events 2.2.0 (2015).  FINDINGS: 1. Severe Obstructive Sleep Apnea with AHI 39.4/hr. 2. No significant Central Sleep Apnea with pAHIc 8.8/hr. 3. Oxygen desaturations as low as 75%. 4. Moderate snoring was present. O2 sats were < 88% for 56.9 min. 5. Total sleep time was 7 hrs and 41 min. 6. 33.5% of total sleep time was spent in REM sleep. 7. Normal sleep onset latency at 16 min. 8. Shortened REM sleep onset latency at 36 min. 9. Total awakenings were 5.  DIAGNOSIS: Severe Obstructive Sleep Apnea (G47.33) Nocturnal Hypoxemia  RECOMMENDATIONS: 1. Clinical correlation of these findings is necessary. The decision to treat obstructive sleep apnea (OSA) is usually based on the presence of apnea symptoms or the presence of associated medical conditions such as Hypertension, Congestive Heart Failure, Atrial Fibrillation or Obesity. The most common symptoms of OSA are snoring, gasping for breath while sleeping, daytime sleepiness and fatigue.  2. Initiating apnea therapy is recommended given the presence of symptoms and/or associated conditions. Recommend proceeding with one of the following:   a.  Auto-CPAP therapy with a pressure range of 5-20cm H2O.   b. An oral appliance (OA) that can be obtained from certain dentists with expertise in sleep medicine. These are primarily of use in non-obese patients with mild and moderate disease.   c. An ENT consultation which may be useful to look for specific causes of obstruction and possible treatment options.   d. If patient is intolerant to PAP therapy, consider referral to ENT for evaluation for hypoglossal nerve stimulator.  3. Close follow-up is necessary to ensure success with CPAP or oral appliance therapy for maximum benefit .  4. A follow-up oximetry study on CPAP is recommended to assess the adequacy of therapy and determine the need for supplemental oxygen or the potential need for Bi-level therapy. An arterial blood gas to determine the adequacy of baseline ventilation and oxygenation should also be considered.  5. Healthy sleep recommendations include: adequate nightly sleep (normal 7-9 hrs/night), avoidance of caffeine after noon and alcohol near bedtime, and maintaining a sleep environment that is cool, dark and quiet.  6. Weight loss for overweight patients is recommended. Even modest amounts of weight loss can significantly improve the severity of sleep apnea.  7. Snoring recommendations include: weight loss where appropriate, side sleeping, and avoidance of alcohol before bed.  8. Operation of motor vehicle should be avoided when sleepy.  Signature: Fransico Him, MD; [facility]; [place], [place] Board of Sleep Medicine Electronically Signed: 12/12/21

## 2021-12-12 ENCOUNTER — Ambulatory Visit: Payer: PPO

## 2021-12-12 DIAGNOSIS — R5383 Other fatigue: Secondary | ICD-10-CM

## 2021-12-12 DIAGNOSIS — Z79899 Other long term (current) drug therapy: Secondary | ICD-10-CM

## 2021-12-12 DIAGNOSIS — R0683 Snoring: Secondary | ICD-10-CM

## 2021-12-12 DIAGNOSIS — I480 Paroxysmal atrial fibrillation: Secondary | ICD-10-CM

## 2021-12-12 DIAGNOSIS — I25118 Atherosclerotic heart disease of native coronary artery with other forms of angina pectoris: Secondary | ICD-10-CM

## 2021-12-28 ENCOUNTER — Telehealth: Payer: Self-pay | Admitting: *Deleted

## 2021-12-28 DIAGNOSIS — I10 Essential (primary) hypertension: Secondary | ICD-10-CM

## 2021-12-28 DIAGNOSIS — R0683 Snoring: Secondary | ICD-10-CM

## 2021-12-28 DIAGNOSIS — G4733 Obstructive sleep apnea (adult) (pediatric): Secondary | ICD-10-CM

## 2021-12-28 DIAGNOSIS — I25118 Atherosclerotic heart disease of native coronary artery with other forms of angina pectoris: Secondary | ICD-10-CM

## 2021-12-28 NOTE — Telephone Encounter (Signed)
-----   Message from Lauralee Evener, [place] sent at 12/12/2021  7:58 AM EDT -----  ----- Message ----- From: Sueanne Margarita, MD Sent: 12/11/2021  10:03 PM EDT To: Cv Div Sleep Studies  Please let patient know that they have sleep apnea.  Recommend therapeutic CPAP titration for treatment of patient's sleep disordered breathing.  If unable to perform an in lab titration then initiate ResMed auto CPAP from 4 to 15cm H2O with heated humidity and mask of choice and overnight pulse ox on CPAP.

## 2021-12-28 NOTE — Telephone Encounter (Signed)
The patient has been notified of the result and verbalized understanding.  All questions (if any) were answered. Marolyn Hammock, CMA 12/28/2021 40:81 AM    Precert Titration

## 2022-01-01 IMAGING — MR MR LUMBAR SPINE W/O CM
5 of 6 series · 26 of 48 positions shown · non-contrast
Comparison: MR lumbar 07/03/2007.

CLINICAL DATA: Sciatica right side; low back pain radiating into
right hip and down his legs for 4 weeks.

EXAM:
MRI LUMBAR SPINE WITHOUT CONTRAST
TECHNIQUE: Multiplanar, multisequence MR imaging of the lumbar spine was
performed. No intravenous contrast was administered.

[Series 5: T2 · sagittal · 4.0mm · 0.81mm/px · 3 of 18 slices shown (1 of 2)]
[im 1/18]
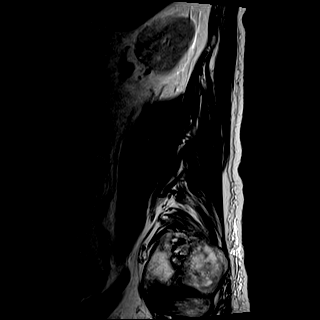
[im 9/18]
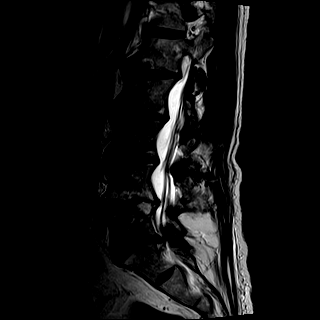
[im 18/18]
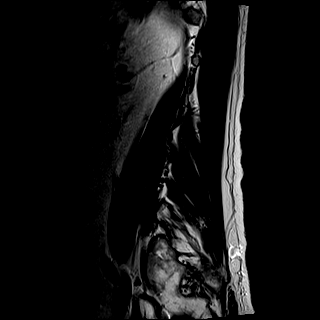

[Series 6: T1 · sagittal · 4.0mm · 0.81mm/px · 4 of 18 slices shown (1 of 2)]
[im 1/18]
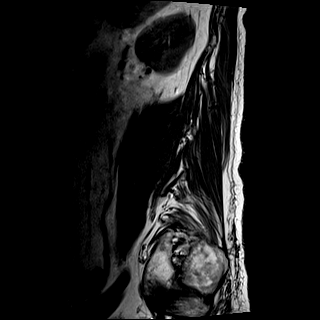
[im 6/18]
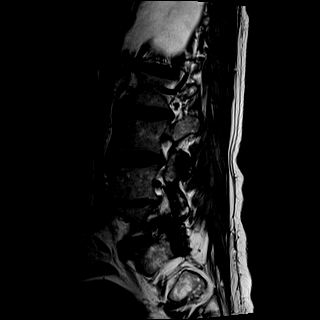
[im 12/18]
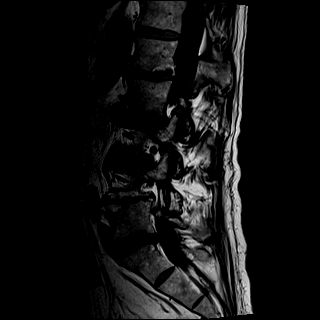
[im 18/18]
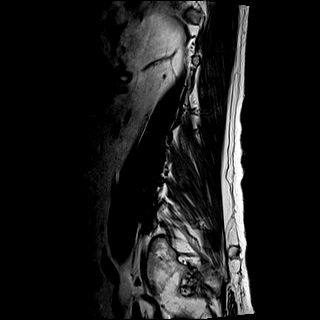

[Series 7: STIR · sagittal · 4.0mm · 0.41mm/px · 3 of 18 slices shown]
[im 1/18]
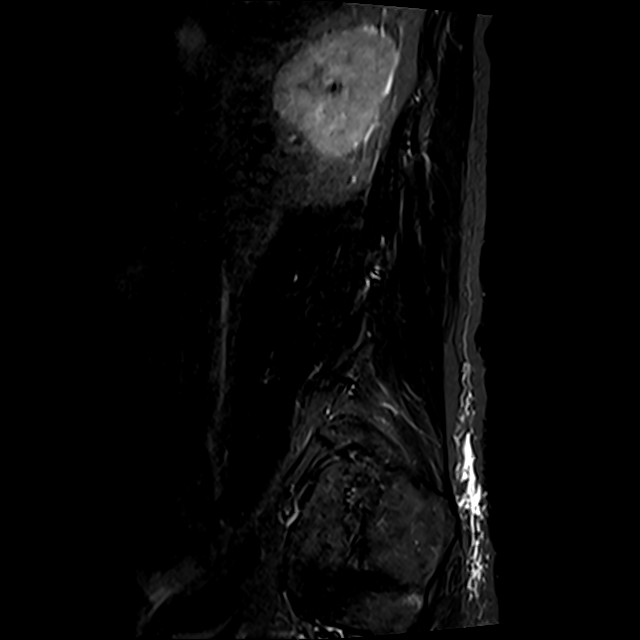
[im 6/18]
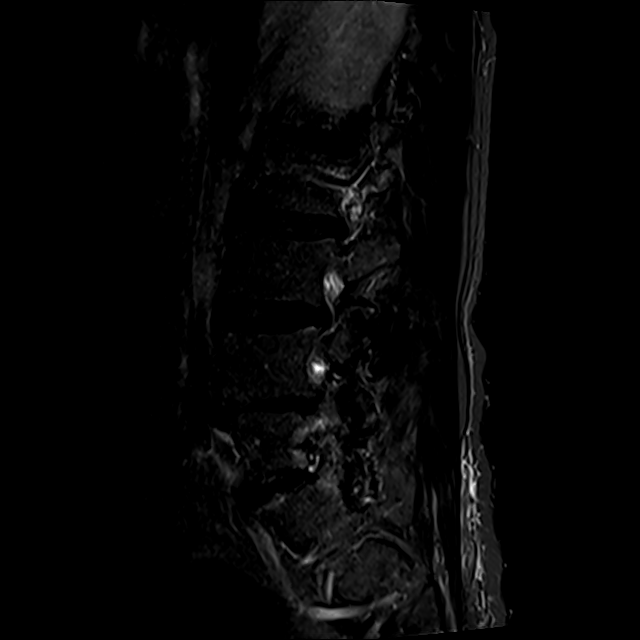
[im 12/18]
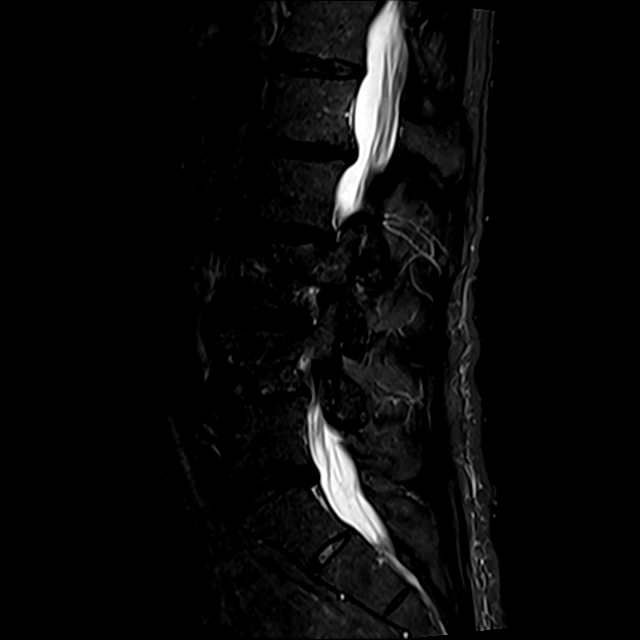

[Series 8: T2 · axial · 4.0mm · 0.78mm/px · z∈[-152,+91]mm · 8 of 38 slices shown (2 of 2)]
[im 1/38]
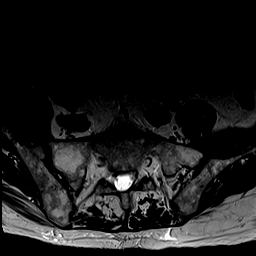
[im 6/38]
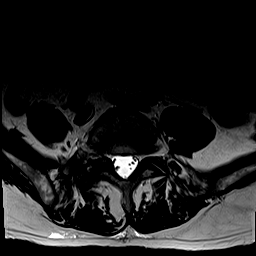
[im 11/38]
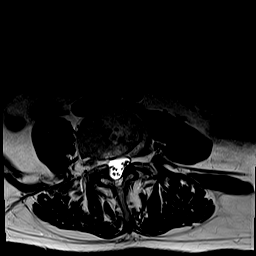
[im 16/38]
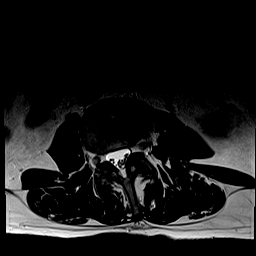
[im 22/38]
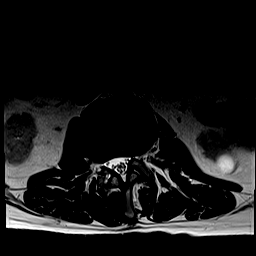
[im 27/38]
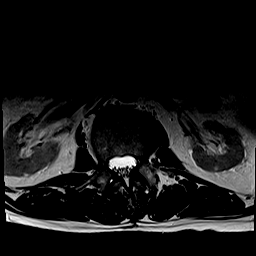
[im 32/38]
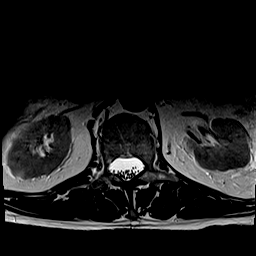
[im 38/38]
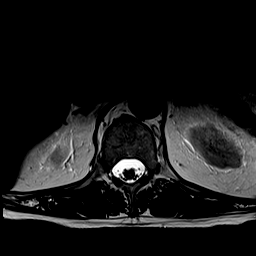

[Series 9: T1 · axial · 4.0mm · 0.39mm/px · z∈[-152,+91]mm · 8 of 38 slices shown (2 of 2)]
[im 1/38]
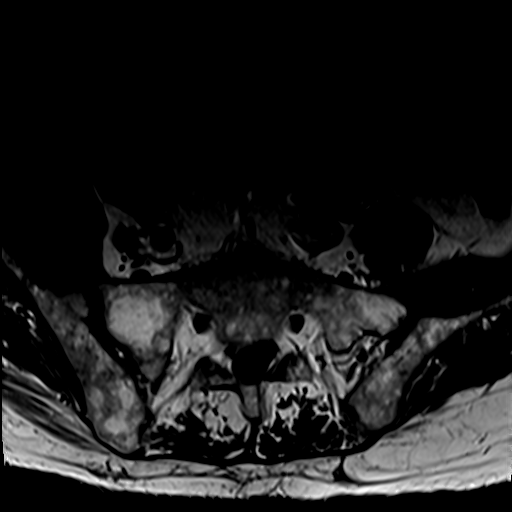
[im 6/38]
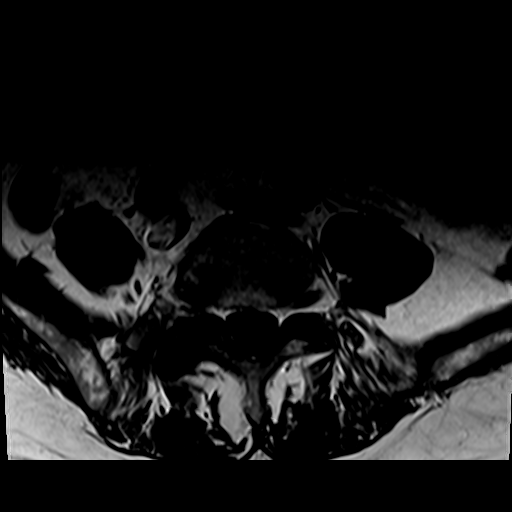
[im 11/38]
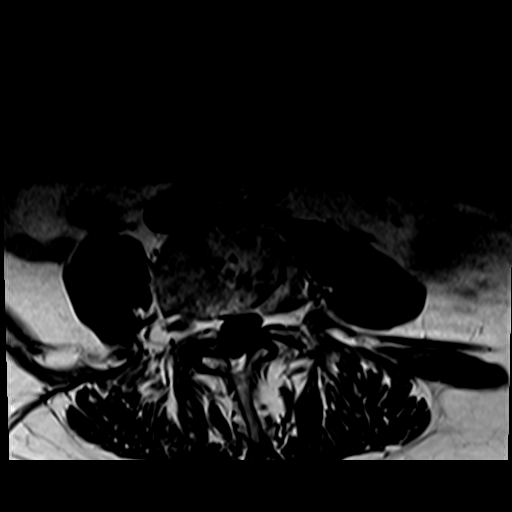
[im 16/38]
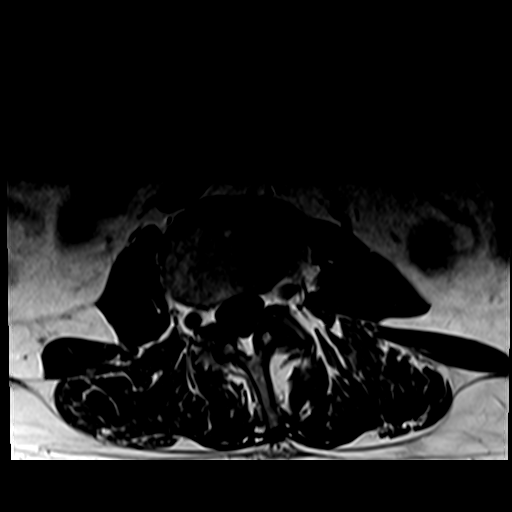
[im 22/38]
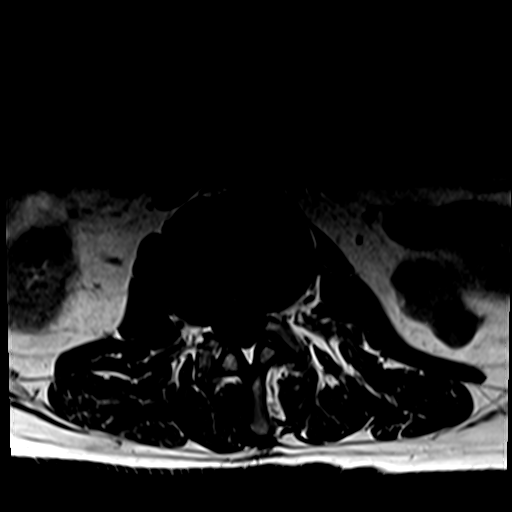
[im 27/38]
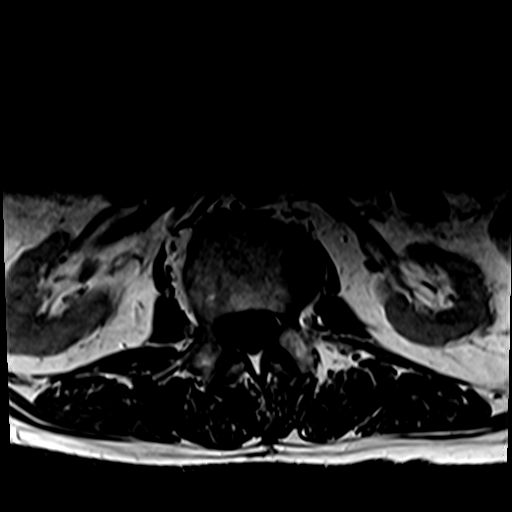
[im 32/38]
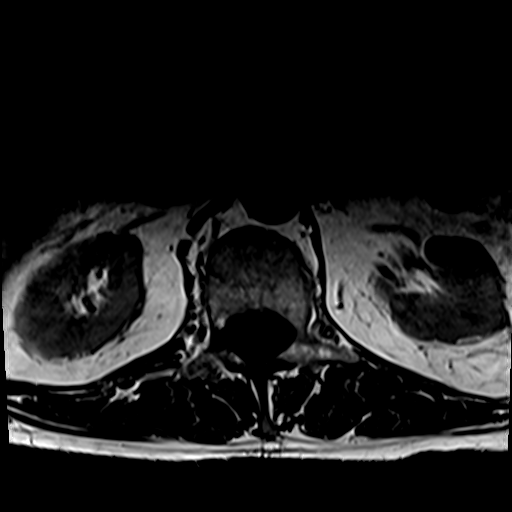
[im 38/38]
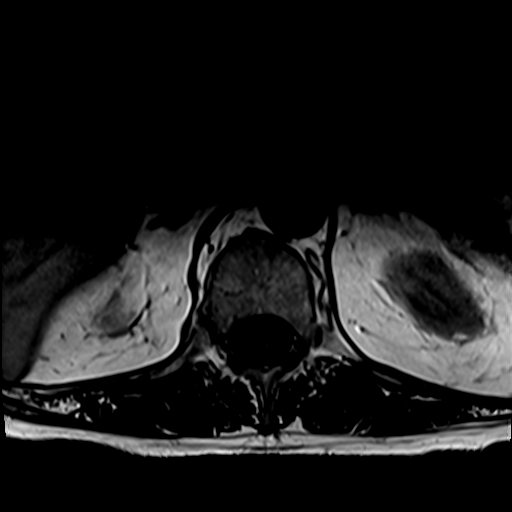

[26 of 48 positions shown; findings below may reference images not displayed]

FINDINGS: Segmentation:  Standard.

Alignment:  Dextrocurvature.

Vertebrae: Multilevel degenerative endplate irregularity. There are
degenerative endplate marrow changes without substantial edema. No
suspicious osseous lesion.

Conus medullaris and cauda equina: Conus extends to the L1 level.
Conus and cauda equina appear normal.

Paraspinal and other soft tissues: Unremarkable.

Disc levels:

L1-L2:  Disc bulge.  No canal or foraminal stenosis.

L2-L3: Disc bulge. No canal or right foraminal stenosis. Narrowing
of the left subarticular recess. Minor left foraminal stenosis.

L3-L4: Disc bulge with small right far lateral annular fissure.
Facet arthropathy with ligamentum flavum infolding. Minor canal
stenosis. Slight narrowing of the subarticular recesses. No right
foraminal stenosis. Mild left foraminal stenosis.

L4-L5: Disc bulge with endplate osteophytic ridging. Facet
arthropathy with ligamentum flavum infolding. Minor canal stenosis.
Narrowing of the right subarticular recess. Mild foraminal stenosis.

L5-S1: Disc bulge. Right (marked) greater than left facet
arthropathy with right ligamentum flavum infolding. No canal
stenosis. Slight effacement right subarticular recess. Moderate
right and mild left foraminal stenosis.
IMPRESSION: Multilevel degenerative changes as detailed above. There is no
high-grade canal stenosis. There is spurring associated with marked
right facet arthropathy at L5-S1 potentially compressing the exiting
L5 nerve root. Right subarticular recess narrowing is present at
L4-L5.

## 2022-01-24 NOTE — Telephone Encounter (Signed)
Patient called in to say he does not want a cpap machine but would like a referral for an oral appliance.

## 2022-01-24 NOTE — Progress Notes (Unsigned)
Cardiology Office Note  Date:  01/25/2022   ID:  Eric Bryant, DOB 1955/11/05, MRN [id number]  PCP:  Dion Body, MD   Chief Complaint  Patient presents with   3 month follow up     Patient c/o a spell of A-fib with shortness of breath while at the beach last Friday; symptoms lasted 1 hour. Medications reviewed by the patient verbally.     HPI:  Eric Bryant is a 66 year old gentleman with past medical history of Coronary artery disease, occluded RCA  in 03/2020 Paroxysmal atrial fibrillation Hypertension History of stroke Chronic kidney disease Who presents for routine follow-up of his paroxysmal atrial fibrillation, coronary disease, hypertension  Last seen in clinic by myself in June 2023 Seen by EP, Dr. Quentin Ore July 2023 Sleep study was ordered On sotalol, Eliquis On meeting with EP, discussed ablation in an effort to come off sotalol  Discussed sleep study results with him, severe sleep apnea reported  He was thinking about a dental device but has not had discussion with [facility] concerning various benefits CPAP versus dental device Was actually waiting for referral to dentist in [place] thinking might like to try this first  1 episode of atrial fibrillation while he was at the beach,  Appreciated symptoms for several hours, eventually performed Valsalva, went back to normal sinus rhythm  just Reports he is taking metoprolol succinate 25 daily with sotalol 80 mg daily Compliant with anticoagulation Eliquis 5 twice daily  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 Fletcher Anon on 04/14/20 revealed severe one-vessel coronary artery  disease with an occluded proximal PRCS with left to right collaterals; 60% proximal left circumflex stenosis and diffuse 30-40% proximal/mid LAD stenosis. Medical management recommended.  He reports having 2-3 episodes of rapid heart rate a month, very symptomatic Only taking sotolol once a day, even previously tried 1/2 pill/month Does not like taking medications in general, reports compliance with his Eliquis Tachycardia episodes lasting 1 hr to 3-4 hrs  Very busy, works at [employer] work reviewed creatinine 1.5, followed by primary care Recent change off amlodipine  on to losartan  EKG personally reviewed by myself on todays visit Normal sinus rhythm with rate 52 bpm no significant ST-T wave changes   PMH:   has a past medical history of Chronic kidney disease, Coronary artery disease, Hypertension, Paroxysmal A-fib ([place]), and Stroke ([place]).  PSH:    Past Surgical History:  Procedure Laterality Date   LEFT HEART CATH AND CORONARY ANGIOGRAPHY N/A 04/14/2020   Procedure: LEFT HEART CATH AND CORONARY ANGIOGRAPHY;  Surgeon: Wellington Hampshire, MD;  Location: [facility];  Service: Cardiovascular;  Laterality: N/A;   LUMBAR LAMINECTOMY/DECOMPRESSION MICRODISCECTOMY Right 05/30/2021   Procedure: Right, Lumbar four-five LAMINOTOMY, FORAMINOROMY;  Surgeon: Newman Pies, MD;  Location: [facility];  Service: Neurosurgery;  Laterality: Right;    Current Outpatient Medications  Medication Sig Dispense Refill   apixaban (ELIQUIS) 5 MG TABS tablet TAKE ONE TABLET EVERY 12 HOURS 180 tablet 1   aspirin EC 81 MG tablet Take 81 mg by mouth in the morning. Swallow whole.     Coenzyme Q10-Vitamin E (QUNOL ULTRA COQ10 PO)  Take 100 mg by mouth every evening.     docusate sodium (COLACE) 100 MG capsule Take 1 capsule (100 mg total) by mouth 2 (two) times daily. 10 capsule 0   HYDROcodone-acetaminophen (NORCO/VICODIN) 5-325 MG tablet Take by mouth.     Lidocaine-Glycerin (PREPARATION H EX)  Place 1 application rectally as needed (hemmorroids/pain.).     losartan (COZAAR) 25 MG tablet Take 25 mg by mouth daily.     metoprolol succinate (TOPROL-XL) 25 MG 24 hr tablet Take 25 mg by mouth in the morning.     Misc Natural Products (PROSTATE SUPPORT PO) Take 500 mg by mouth in the morning. [place]     Omega-3 Fatty Acids (FISH OIL PO) Take 1,200 mg by mouth in the morning.     rosuvastatin (CRESTOR) 20 MG tablet Take 1 tablet (20 mg total) by mouth daily. 30 tablet 3   Saw Palmetto 450 MG CAPS Take 450 mg by mouth in the morning.     sotalol (BETAPACE) 160 MG tablet Take 160 mg by mouth in the morning.     acetaminophen (TYLENOL) 500 MG tablet Take 500-1,000 mg by mouth every 6 (six) hours as needed (for pain). (Patient not taking: Reported on 01/25/2022)     oxyCODONE-acetaminophen (PERCOCET) 5-325 MG tablet Take 1 tablet by mouth every 4 (four) hours as needed for severe pain. (Patient not taking: Reported on 01/25/2022) 30 tablet 0   No current facility-administered medications for this visit.     Allergies:   Penicillins   Social History:  The patient  reports that he has quit smoking. His smoking use included cigarettes. He has quit using smokeless tobacco.  His smokeless tobacco use included chew. He reports that he does not currently use alcohol. He reports that he does not use drugs.   Family History:   family history includes Breast cancer in his mother; Cancer in his mother; Heart attack (age of onset: 52) in his father; Heart disease in his father; Hyperlipidemia in his brother.    Review of Systems: Review of Systems  Constitutional: Negative.   HENT: Negative.    Respiratory: Negative.    Cardiovascular:  Positive for palpitations.  Gastrointestinal: Negative.   Musculoskeletal: Negative.   Neurological: Negative.   Psychiatric/Behavioral: Negative.    All other systems reviewed and are negative.    PHYSICAL EXAM: VS:  BP (!) 140/72 (BP Location: Left Arm,  Patient Position: Sitting, Cuff Size: Normal)   Pulse (!) 51   Ht '5\' 10"'$  (1.778 m)   Wt 165 lb 8 oz (75.1 kg)   SpO2 98%   BMI 23.75 kg/m  , BMI Body mass index is 23.75 kg/m. GEN: Well nourished, well developed, in no acute distress HEENT: normal Neck: no JVD, carotid bruits, or masses Cardiac: RRR; no murmurs, rubs, or gallops,no edema  Respiratory:  clear to auscultation bilaterally, normal work of breathing GI: soft, nontender, nondistended, + BS MS: no deformity or atrophy Skin: warm and dry, no rash Neuro:  Strength and sensation are intact Psych: euthymic mood, full affect   Recent Labs: 05/26/2021: BUN 24; Creatinine, Ser 1.49; Hemoglobin 15.3; Platelets 197; Potassium 4.4; Sodium 138    Lipid Panel No results found for: "CHOL", "HDL", "LDLCALC", "TRIG"    Wt Readings from Last 3 Encounters:  01/25/22 165 lb 8 oz (75.1 kg)  11/23/21 164 lb (74.4 kg)  10/18/21 168 lb (76.2 kg)     ASSESSMENT AND PLAN:  Problem List Items Addressed This Visit  Cardiology Problems   CAD (coronary artery disease) - Primary   Relevant Orders   EKG 12-Lead   Paroxysmal atrial fibrillation (HCC)   Relevant Orders   EKG 12-Lead   Hyperlipidemia   Essential hypertension   Relevant Orders   EKG 12-Lead     Other   Chronic kidney disease   Other Visit Diagnoses     OSA (obstructive sleep apnea)       Relevant Orders   EKG 12-Lead   Snoring         Atrial fibrillation, paroxysmal Has been evaluated by EP Diagnosis of sleep apnea, severe He would like further discussion with Dr. Radford Pax concerning whether he would benefit more from dental device versus CPAP We will try to arrange a video visit If no improvement in his A-fib with management of sleep apnea, would likely be a candidate for ablation No medication changes made on today's visit  Coronary disease with stable angina Cholesterol at goal Currently with no symptoms of angina. No further workup at this time.  Continue current medication regimen.  Hyperlipidemia Cholesterol is at goal on the current lipid regimen. No changes to the medications were made.  Essential hypertension Blood pressure is well controlled on today's visit. No changes made to the medications.   Total encounter time more than 30 minutes  Greater than 50% was spent in counseling and coordination of care with the patient    Signed, Esmond Plants, M.D., Ph.D. [employer], [place]

## 2022-01-25 ENCOUNTER — Ambulatory Visit: Payer: PPO | Attending: Cardiovascular Disease | Admitting: Cardiovascular Disease

## 2022-01-25 ENCOUNTER — Encounter: Payer: Self-pay | Admitting: Cardiovascular Disease

## 2022-01-25 VITALS — BP 140/72 | HR 51 | Ht 70.0 in | Wt 165.5 lb

## 2022-01-25 DIAGNOSIS — I10 Essential (primary) hypertension: Secondary | ICD-10-CM | POA: Diagnosis not present

## 2022-01-25 DIAGNOSIS — R0683 Snoring: Secondary | ICD-10-CM

## 2022-01-25 DIAGNOSIS — I480 Paroxysmal atrial fibrillation: Secondary | ICD-10-CM | POA: Diagnosis not present

## 2022-01-25 DIAGNOSIS — E782 Mixed hyperlipidemia: Secondary | ICD-10-CM | POA: Diagnosis not present

## 2022-01-25 DIAGNOSIS — G4733 Obstructive sleep apnea (adult) (pediatric): Secondary | ICD-10-CM

## 2022-01-25 DIAGNOSIS — N189 Chronic kidney disease, unspecified: Secondary | ICD-10-CM | POA: Diagnosis not present

## 2022-01-25 DIAGNOSIS — I25118 Atherosclerotic heart disease of native coronary artery with other forms of angina pectoris: Secondary | ICD-10-CM

## 2022-01-25 NOTE — Patient Instructions (Addendum)
We will set up a video chat to discuss the options you have for your sleep apnea. Someone from our [place] office will give you a call.   Medication Instructions:  No changes  If you need a refill on your cardiac medications before your next appointment, please call your pharmacy.   Lab work: No new labs needed  Testing/Procedures: No new testing needed  Follow-Up: At [facility], you and your health needs are our priority.  As part of our continuing mission to provide you with exceptional heart care, we have created designated Provider Care Teams.  These Care Teams include your primary Cardiologist (physician) and Advanced Practice Providers (APPs -  Physician Assistants and Nurse Practitioners) who all work together to provide you with the care you need, when you need it.  You will need a follow up appointment in 6 months  Providers on your designated Care Team:   Murray Hodgkins, NP Christell Faith, PA-C Cadence Kathlen Mody, [place]  COVID-19 Vaccine Information can be found at: [email or web address] For questions related to vaccine distribution or appointments, please email [email or web address] or call [phone].

## 2022-01-31 DIAGNOSIS — M48061 Spinal stenosis, lumbar region without neurogenic claudication: Secondary | ICD-10-CM | POA: Diagnosis not present

## 2022-01-31 DIAGNOSIS — M47816 Spondylosis without myelopathy or radiculopathy, lumbar region: Secondary | ICD-10-CM | POA: Diagnosis not present

## 2022-02-02 NOTE — Telephone Encounter (Signed)
The patient has been notified via voicemail. Left detailed message on voicemail and informed patient to call back to discuss Dr Landis Gandy recommendations.Marolyn Hammock, CMA .

## 2022-03-03 ENCOUNTER — Encounter: Payer: Self-pay | Admitting: Cardiovascular Disease

## 2022-03-05 NOTE — Telephone Encounter (Signed)
-----   Message from Sueanne Margarita, MD sent at 01/25/2022 11:21 AM EDT ----- Regarding: RE: osa video chat Gae Bon can you let patient know that since he has severe OSA an oral device is not recommended and he needs CPAP.  If he wants to discuss it further then set up a virtual visit for next Monday afternoon ----- Message ----- From: Minna Merritts, MD Sent: 01/25/2022  10:08 AM EDT To: Sueanne Margarita, MD; Freada Bergeron, CMA Subject: osa video chat                                 Eyvonne Mechanic, Can you set up a video chat appointment with Mr. Baudoin to talk about his sleep apnea ? He does not know whether to go with dental appliance or CPAP, wants to do what is best for his A-fib and sleep hygiene He would be interested to know what you think is the best option, which works best. If you have a time in your scheduled to do a video chat that may work best as he works in [employer]

## 2022-03-16 ENCOUNTER — Other Ambulatory Visit: Payer: Self-pay | Admitting: Cardiology

## 2022-03-16 DIAGNOSIS — I480 Paroxysmal atrial fibrillation: Secondary | ICD-10-CM

## 2022-03-16 NOTE — Telephone Encounter (Signed)
Prescription refill request for Eliquis received. Indication: Afib  Last office visit:01/25/22 ([place])  Scr: 1.58 (10/10/21) Age: 66 Weight: 75.1kg  Appropriate dose and refill sent to requested pharmacy.

## 2022-03-28 ENCOUNTER — Ambulatory Visit: Payer: PPO | Admitting: Cardiology

## 2022-04-10 DIAGNOSIS — Z125 Encounter for screening for malignant neoplasm of prostate: Secondary | ICD-10-CM | POA: Diagnosis not present

## 2022-04-10 DIAGNOSIS — N1831 Chronic kidney disease, stage 3a: Secondary | ICD-10-CM | POA: Diagnosis not present

## 2022-04-10 DIAGNOSIS — Z8673 Personal history of transient ischemic attack (TIA), and cerebral infarction without residual deficits: Secondary | ICD-10-CM | POA: Diagnosis not present

## 2022-04-10 DIAGNOSIS — I25118 Atherosclerotic heart disease of native coronary artery with other forms of angina pectoris: Secondary | ICD-10-CM | POA: Diagnosis not present

## 2022-04-10 DIAGNOSIS — I10 Essential (primary) hypertension: Secondary | ICD-10-CM | POA: Diagnosis not present

## 2022-04-10 DIAGNOSIS — I480 Paroxysmal atrial fibrillation: Secondary | ICD-10-CM | POA: Diagnosis not present

## 2022-04-17 DIAGNOSIS — I10 Essential (primary) hypertension: Secondary | ICD-10-CM | POA: Diagnosis not present

## 2022-04-17 DIAGNOSIS — Z0000 Encounter for general adult medical examination without abnormal findings: Secondary | ICD-10-CM | POA: Diagnosis not present

## 2022-04-17 DIAGNOSIS — I25118 Atherosclerotic heart disease of native coronary artery with other forms of angina pectoris: Secondary | ICD-10-CM | POA: Diagnosis not present

## 2022-04-17 DIAGNOSIS — N1831 Chronic kidney disease, stage 3a: Secondary | ICD-10-CM | POA: Diagnosis not present

## 2022-05-11 ENCOUNTER — Other Ambulatory Visit: Payer: Self-pay | Admitting: Cardiovascular Disease

## 2022-07-11 DIAGNOSIS — D2262 Melanocytic nevi of left upper limb, including shoulder: Secondary | ICD-10-CM | POA: Diagnosis not present

## 2022-07-11 DIAGNOSIS — D044 Carcinoma in situ of skin of scalp and neck: Secondary | ICD-10-CM | POA: Diagnosis not present

## 2022-07-11 DIAGNOSIS — D2272 Melanocytic nevi of left lower limb, including hip: Secondary | ICD-10-CM | POA: Diagnosis not present

## 2022-07-11 DIAGNOSIS — L821 Other seborrheic keratosis: Secondary | ICD-10-CM | POA: Diagnosis not present

## 2022-07-11 DIAGNOSIS — D2261 Melanocytic nevi of right upper limb, including shoulder: Secondary | ICD-10-CM | POA: Diagnosis not present

## 2022-07-11 DIAGNOSIS — D2271 Melanocytic nevi of right lower limb, including hip: Secondary | ICD-10-CM | POA: Diagnosis not present

## 2022-07-11 DIAGNOSIS — L905 Scar conditions and fibrosis of skin: Secondary | ICD-10-CM | POA: Diagnosis not present

## 2022-07-11 DIAGNOSIS — D485 Neoplasm of uncertain behavior of skin: Secondary | ICD-10-CM | POA: Diagnosis not present

## 2022-07-11 DIAGNOSIS — D225 Melanocytic nevi of trunk: Secondary | ICD-10-CM | POA: Diagnosis not present

## 2022-07-25 ENCOUNTER — Ambulatory Visit: Payer: PPO | Admitting: Cardiovascular Disease

## 2022-07-27 ENCOUNTER — Other Ambulatory Visit: Payer: Self-pay | Admitting: Cardiovascular Disease

## 2022-08-10 ENCOUNTER — Other Ambulatory Visit: Payer: Self-pay | Admitting: Cardiovascular Disease

## 2022-08-21 ENCOUNTER — Other Ambulatory Visit: Payer: Self-pay | Admitting: Cardiovascular Disease

## 2022-08-22 NOTE — Telephone Encounter (Signed)
Please advise on directions for metoprolol per request from pharmacy (see below). I do not see mention of this in Dr. Windell Hummingbird last office note. Thank you!

## 2022-08-23 DIAGNOSIS — L988 Other specified disorders of the skin and subcutaneous tissue: Secondary | ICD-10-CM | POA: Diagnosis not present

## 2022-08-23 DIAGNOSIS — D044 Carcinoma in situ of skin of scalp and neck: Secondary | ICD-10-CM | POA: Diagnosis not present

## 2022-08-23 DIAGNOSIS — L814 Other melanin hyperpigmentation: Secondary | ICD-10-CM | POA: Diagnosis not present

## 2022-08-23 DIAGNOSIS — L578 Other skin changes due to chronic exposure to nonionizing radiation: Secondary | ICD-10-CM | POA: Diagnosis not present

## 2022-09-20 ENCOUNTER — Other Ambulatory Visit: Payer: Self-pay | Admitting: Cardiovascular Disease

## 2022-10-11 DIAGNOSIS — I129 Hypertensive chronic kidney disease with stage 1 through stage 4 chronic kidney disease, or unspecified chronic kidney disease: Secondary | ICD-10-CM | POA: Diagnosis not present

## 2022-10-11 DIAGNOSIS — I25118 Atherosclerotic heart disease of native coronary artery with other forms of angina pectoris: Secondary | ICD-10-CM | POA: Diagnosis not present

## 2022-10-11 DIAGNOSIS — N1831 Chronic kidney disease, stage 3a: Secondary | ICD-10-CM | POA: Diagnosis not present

## 2022-10-18 DIAGNOSIS — I10 Essential (primary) hypertension: Secondary | ICD-10-CM | POA: Diagnosis not present

## 2022-10-18 DIAGNOSIS — I480 Paroxysmal atrial fibrillation: Secondary | ICD-10-CM | POA: Diagnosis not present

## 2022-10-18 DIAGNOSIS — N1831 Chronic kidney disease, stage 3a: Secondary | ICD-10-CM | POA: Diagnosis not present

## 2022-10-29 NOTE — Progress Notes (Unsigned)
Cardiology Office Note  Date:  10/30/2022   ID:  Eric Bryant, DOB Apr 15, 1956, MRN [id number]  PCP:  Marisue Ivan, MD   Chief Complaint  Patient presents with   6 month follow up     "Doing well." Medications reviewed by the patient verbally.     HPI:  Eric Bryant is a 67 year old gentleman with past medical history of Coronary artery disease, occluded RCA  in 03/2020 Paroxysmal atrial fibrillation Hypertension History of stroke Chronic kidney disease Who presents for routine follow-up of his paroxysmal atrial fibrillation, coronary disease, hypertension  Last seen in clinic by myself in September 2023  Seen by EP, Dr. Lalla Brothers July 2023 Severe sleep apnea, recommendation for CPAP Has not started wearing CPAP yet, reports he needs to call to get this set up  On sotalol 160 mg once a day metoprolol succinate 25 daily, Eliquis 5 twice daily On meeting with EP, discussed ablation in an effort to come off sotalol  Lab work reviewed Total cholesterol 133 LDL 68 Potassium 4.7 CR 1.5  Rare episodes of atrial fibrillation, seem to happen more at nighttime 135 bpm, He might have 3 in one week, then none for weeks Happens more when tired  Arm bruising On aspirin and Eliquis Reports having stroke before he was on Eliquis, no TIA or stroke symptoms since then  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 Kirke Corin on 04/14/20 revealed severe one-vessel  coronary artery disease with an occluded proximal PRCS with left to right collaterals; 60% proximal left circumflex stenosis and diffuse 30-40% proximal/mid LAD stenosis. Medical management recommended.  He reports having 2-3 episodes of rapid heart rate a month, very symptomatic Only taking sotolol once a day, even previously tried 1/2 pill/month Does not like taking medications in general, reports compliance with his Eliquis Tachycardia episodes lasting 1 hr to 3-4 hrs  Very busy, works at [employer] work reviewed creatinine 1.5, followed by primary care Recent change off amlodipine  on to losartan  EKG personally reviewed by myself on todays visit Normal sinus rhythm with rate 52 bpm no significant ST-T wave changes   PMH:   has a past medical history of Chronic kidney disease, Coronary artery disease, Hypertension, Paroxysmal A-fib (HCC), and Stroke (HCC).  PSH:    Past Surgical History:  Procedure Laterality Date   LEFT HEART CATH AND CORONARY ANGIOGRAPHY N/A 04/14/2020   Procedure: LEFT HEART CATH AND CORONARY ANGIOGRAPHY;  Surgeon: Iran Ouch, MD;  [facility];  Service: Cardiovascular;  Laterality: N/A;   LUMBAR LAMINECTOMY/DECOMPRESSION MICRODISCECTOMY Right 05/30/2021   Procedure: Right, Lumbar four-five LAMINOTOMY, FORAMINOROMY;  Surgeon: Tressie Stalker, MD;  Location: [facility] OR;  Service: Neurosurgery;  Laterality: Right;    Current Outpatient Medications  Medication Sig Dispense Refill   aspirin EC 81 MG tablet Take 81 mg by mouth in the morning. Swallow whole.     Coenzyme Q10-Vitamin E (QUNOL ULTRA COQ10 PO) Take 100 mg by mouth every evening.  ELIQUIS 5 MG TABS tablet TAKE ONE TABLET EVERY 12 HOURS 180 tablet 1   losartan (COZAAR) 25 MG tablet Take 25 mg by mouth daily.     metoprolol succinate (TOPROL-XL) 25 MG 24 hr tablet TAKE 1 TABLET BY MOUTH DAILY 90 tablet 0   Misc Natural Products (PROSTATE SUPPORT PO) Take 500 mg by mouth in the  morning. Pygeum     Omega-3 Fatty Acids (FISH OIL PO) Take 1,200 mg by mouth in the morning.     rosuvastatin (CRESTOR) 20 MG tablet TAKE ONE TABLET EVERY DAY 30 tablet 0   Saw Palmetto 450 MG CAPS Take 450 mg by mouth in the morning.     sotalol (BETAPACE) 160 MG tablet Take 160 mg by mouth in the morning.     acetaminophen (TYLENOL) 500 MG tablet Take 500-1,000 mg by mouth every 6 (six) hours as needed (for pain). (Patient not taking: Reported on 01/25/2022)     HYDROcodone-acetaminophen (NORCO/VICODIN) 5-325 MG tablet Take by mouth. (Patient not taking: Reported on 10/30/2022)     Lidocaine-Glycerin (PREPARATION H EX) Place 1 application rectally as needed (hemmorroids/pain.). (Patient not taking: Reported on 10/30/2022)     No current facility-administered medications for this visit.     Allergies:   Penicillins   Social History:  The patient  reports that he has quit smoking. His smoking use included cigarettes. He has quit using smokeless tobacco.  His smokeless tobacco use included chew. He reports that he does not currently use alcohol. He reports that he does not use drugs.   Family History:   family history includes Breast cancer in his mother; Cancer in his mother; Heart attack (age of onset: 65) in his father; Heart disease in his father; Hyperlipidemia in his brother.    Review of Systems: Review of Systems  Constitutional: Negative.   HENT: Negative.    Respiratory: Negative.    Cardiovascular:  Positive for palpitations.  Gastrointestinal: Negative.   Musculoskeletal: Negative.   Neurological: Negative.   Psychiatric/Behavioral: Negative.    All other systems reviewed and are negative.   PHYSICAL EXAM: VS:  BP 138/70 (BP Location: Left Arm, Patient Position: Sitting, Cuff Size: Normal)   Pulse (!) 59   Ht 5\' 10"  (1.778 m)   Wt 178 lb 8 oz (81 kg)   SpO2 98%   BMI 25.61 kg/m  , BMI Body mass index is 25.61 kg/m. Constitutional:  oriented to person, place, and time.  No distress.  HENT:  Head: Grossly normal Eyes:  no discharge. No scleral icterus.  Neck: No JVD, no carotid bruits  Cardiovascular: Regular rate and rhythm, no murmurs appreciated Pulmonary/Chest: Clear to auscultation bilaterally, no wheezes or rails Abdominal: Soft.  no distension.  no tenderness.  Musculoskeletal: Normal range of motion Neurological:  normal muscle tone. Coordination normal. No atrophy Skin: Skin warm and dry Psychiatric: normal affect, pleasant  Recent Labs: No results found for requested labs within last 365 days.    Lipid Panel No results found for: "CHOL", "HDL", "LDLCALC", "TRIG"    Wt Readings from Last 3 Encounters:  10/30/22 178 lb 8 oz (81 kg)  01/25/22 165 lb 8 oz (75.1 kg)  11/23/21 164 lb (74.4 kg)     ASSESSMENT AND PLAN:  Problem List Items Addressed This Visit       Cardiology Problems   CAD (coronary artery disease) - Primary   Relevant Orders   EKG 12-Lead (Completed)   Paroxysmal atrial fibrillation (HCC)  Relevant Orders   EKG 12-Lead (Completed)   Hyperlipidemia   Essential hypertension   Relevant Orders   EKG 12-Lead (Completed)     Other   History of stroke   Chronic kidney disease   Other Visit Diagnoses     OSA (obstructive sleep apnea)         Atrial fibrillation, paroxysmal Has been evaluated by EP Diagnosis of sleep apnea, severe Recommend he go ahead and get set up with CPAP As most A-fib episodes overnight recommend he take his sotalol at dinnertime, continue metoprolol succinate in the morning For  increasing episodes of atrial fibrillation may need to increase sotalol up to twice daily dosing (could add extra 80 mg in the morning with 160 in the evening)  Coronary disease with stable angina Reports having some tightness in the chest if he does more than his usual Recommend if symptoms get worse, Myoview could be ordered, for significant symptoms may need to go repeat catheterization given residual  disease 60% circumflex 40% LAD in 2021  Hyperlipidemia Cholesterol is at goal on the current lipid regimen. No changes to the medications were made.  Declining Zetia  Essential hypertension Blood pressure elevated this morning, has been rushing around, reports blood pressure better controlled at home   Total encounter time more than 30 minutes  Greater than 50% was spent in counseling and coordination of care with the patient    Signed, Dossie Arbour, M.D., Ph.D. [facility] [place], [place] [phone]

## 2022-10-30 ENCOUNTER — Encounter: Payer: Self-pay | Admitting: Cardiovascular Disease

## 2022-10-30 ENCOUNTER — Ambulatory Visit: Payer: PPO | Admitting: Cardiovascular Disease

## 2022-10-30 ENCOUNTER — Ambulatory Visit: Payer: PPO | Attending: Cardiovascular Disease | Admitting: Cardiovascular Disease

## 2022-10-30 VITALS — BP 138/70 | HR 59 | Ht 70.0 in | Wt 178.5 lb

## 2022-10-30 DIAGNOSIS — Z8673 Personal history of transient ischemic attack (TIA), and cerebral infarction without residual deficits: Secondary | ICD-10-CM | POA: Diagnosis not present

## 2022-10-30 DIAGNOSIS — G4733 Obstructive sleep apnea (adult) (pediatric): Secondary | ICD-10-CM

## 2022-10-30 DIAGNOSIS — I25118 Atherosclerotic heart disease of native coronary artery with other forms of angina pectoris: Secondary | ICD-10-CM

## 2022-10-30 DIAGNOSIS — N189 Chronic kidney disease, unspecified: Secondary | ICD-10-CM

## 2022-10-30 DIAGNOSIS — I10 Essential (primary) hypertension: Secondary | ICD-10-CM | POA: Diagnosis not present

## 2022-10-30 DIAGNOSIS — I480 Paroxysmal atrial fibrillation: Secondary | ICD-10-CM | POA: Diagnosis not present

## 2022-10-30 DIAGNOSIS — E782 Mixed hyperlipidemia: Secondary | ICD-10-CM | POA: Diagnosis not present

## 2022-10-30 NOTE — Patient Instructions (Addendum)
Medication Instructions:  Stay on metoprolol in the Am Sotalol at dinner  OK to stop aspirin  If you need a refill on your cardiac medications before your next appointment, please call your pharmacy.   Lab work: No new labs needed  Testing/Procedures: No new testing needed  Follow-Up: At [facility], you and your health needs are our priority.  As part of our continuing mission to provide you with exceptional heart care, we have created designated Provider Care Teams.  These Care Teams include your primary Cardiologist (physician) and Advanced Practice Providers (APPs -  Physician Assistants and Nurse Practitioners) who all work together to provide you with the care you need, when you need it.  You will need a follow up appointment in 12 months  Providers on your designated Care Team:   Nicolasa Ducking, NP Eula Listen, PA-C Cadence Fransico Michael, [place]  COVID-19 Vaccine Information can be found at: [email or web address] For questions related to vaccine distribution or appointments, please email [email or web address] or call [phone].

## 2022-11-03 NOTE — Progress Notes (Signed)
Order(s) created erroneously. Erroneous order ID: [id number]  Order moved by: Ian Malkin  Order move date/time: 11/03/2022 2:24 PM  Source Patient: [id number]  Source Contact: 10/30/2022  Destination Patient: [id number]  Destination Contact: 10/11/2022

## 2022-11-06 ENCOUNTER — Other Ambulatory Visit: Payer: Self-pay | Admitting: Cardiovascular Disease

## 2022-11-23 ENCOUNTER — Other Ambulatory Visit: Payer: Self-pay | Admitting: Cardiovascular Disease

## 2022-12-11 ENCOUNTER — Other Ambulatory Visit: Payer: Self-pay | Admitting: Cardiovascular Disease

## 2022-12-11 DIAGNOSIS — I480 Paroxysmal atrial fibrillation: Secondary | ICD-10-CM

## 2022-12-11 NOTE — Telephone Encounter (Signed)
Prescription refill request for Eliquis received. Indication: PAF Last office visit: 10/30/22  Concha Se MD Scr: 1.5 on 10/11/22  Epic Age: 67 Weight: 81kg  Based on above findings Eliquis 5mg  twice daily is the appropriate dose.  Refill approved.

## 2022-12-11 NOTE — Telephone Encounter (Signed)
Please review

## 2023-01-13 DIAGNOSIS — M79671 Pain in right foot: Secondary | ICD-10-CM | POA: Diagnosis not present

## 2023-02-13 ENCOUNTER — Other Ambulatory Visit: Payer: Self-pay | Admitting: Cardiovascular Disease

## 2023-04-13 ENCOUNTER — Other Ambulatory Visit: Payer: Self-pay | Admitting: Cardiovascular Disease

## 2023-04-13 DIAGNOSIS — N1831 Chronic kidney disease, stage 3a: Secondary | ICD-10-CM | POA: Diagnosis not present

## 2023-04-13 DIAGNOSIS — I480 Paroxysmal atrial fibrillation: Secondary | ICD-10-CM | POA: Diagnosis not present

## 2023-04-13 DIAGNOSIS — I10 Essential (primary) hypertension: Secondary | ICD-10-CM | POA: Diagnosis not present

## 2023-04-13 DIAGNOSIS — Z125 Encounter for screening for malignant neoplasm of prostate: Secondary | ICD-10-CM | POA: Diagnosis not present

## 2023-04-20 DIAGNOSIS — I10 Essential (primary) hypertension: Secondary | ICD-10-CM | POA: Diagnosis not present

## 2023-04-20 DIAGNOSIS — I25118 Atherosclerotic heart disease of native coronary artery with other forms of angina pectoris: Secondary | ICD-10-CM | POA: Diagnosis not present

## 2023-04-20 DIAGNOSIS — N1831 Chronic kidney disease, stage 3a: Secondary | ICD-10-CM | POA: Diagnosis not present

## 2023-04-20 DIAGNOSIS — I480 Paroxysmal atrial fibrillation: Secondary | ICD-10-CM | POA: Diagnosis not present

## 2023-04-26 ENCOUNTER — Other Ambulatory Visit: Payer: Self-pay | Admitting: Cardiovascular Disease

## 2023-04-26 NOTE — Telephone Encounter (Signed)
Med listed as historical, are we managing. Please review for refill

## 2023-04-30 ENCOUNTER — Telehealth: Payer: Self-pay | Admitting: Cardiovascular Disease

## 2023-04-30 NOTE — Telephone Encounter (Signed)
*  STAT* If patient is at the pharmacy, call can be transferred to refill team.   1. Which medications need to be refilled? (please list name of each medication and dose if known) sotalol (BETAPACE) 160 MG tablet - wife reports he is taking this once a day    4. Which pharmacy/location (including street and city if local pharmacy) is medication to be sent to? [facility] - [place], [place] - [id number] Phone: [phone]  Fax: [phone]       5. Do they need a 30 day or 90 day supply? 90

## 2023-04-30 NOTE — Telephone Encounter (Signed)
Pending Response from Dr. Ladonna Snide, Winfield Rast, RN  Antonieta Iba, MD3 days ago    [facility] to fill under your name?   Harrington, April L routed conversation to Jani Gravel, RN4 days ago   Harrington, April L4 days ago   Med listed as historical, are we managing. Please review for refill

## 2023-05-07 MED ORDER — SOTALOL HCL 160 MG PO TABS: 160.0000 mg | ORAL_TABLET | Freq: Every morning | ORAL | 1 refills | Status: DC

## 2023-05-07 NOTE — Addendum Note (Signed)
 Addended by: Jani Gravel on: 05/07/2023 01:36 PM   Modules accepted: Orders

## 2023-05-07 NOTE — Telephone Encounter (Signed)
 Called patient and notified him of the following from Dr. Gollan.  Would recommend EKG as he is on sotalol , should be looked at every 6 months Perhaps this could be arranged through the hospital if unable to do in the clinic We can refill sotalol  160 daily for 6 months Thx TGollan   Patient verbalizes understanding. Message sent to scheduling to schedule nurse visit for EKG.

## 2023-05-07 NOTE — Telephone Encounter (Signed)
Please see updated encounter  

## 2023-05-07 NOTE — Telephone Encounter (Signed)
 Patient is requesting call back in regards to medication being filled. She states that he only has one left.   Requesting call back at his work if you can not reach him on his mobile #  Wk#: [phone]

## 2023-05-14 ENCOUNTER — Other Ambulatory Visit: Payer: Self-pay | Admitting: Cardiovascular Disease

## 2023-05-14 DIAGNOSIS — I480 Paroxysmal atrial fibrillation: Secondary | ICD-10-CM

## 2023-05-14 NOTE — Telephone Encounter (Signed)
 Refill request

## 2023-05-14 NOTE — Telephone Encounter (Signed)
 Prescription refill request for Eliquis received. Indication:afib Last office visit:7/24 Scr:1.5  12/24 Age: 68 Weight:81  kg  Prescription refilled

## 2023-05-15 ENCOUNTER — Telehealth: Payer: Self-pay | Admitting: Cardiovascular Disease

## 2023-05-15 NOTE — Telephone Encounter (Signed)
 Attempted to contact patient to schedule EKG. Unable to leave voicemail.

## 2023-06-19 ENCOUNTER — Ambulatory Visit: Payer: PPO | Admitting: Cardiovascular Disease

## 2023-07-09 ENCOUNTER — Ambulatory Visit: Payer: PPO | Admitting: Cardiovascular Disease

## 2023-07-27 ENCOUNTER — Other Ambulatory Visit: Payer: Self-pay | Admitting: Cardiovascular Disease

## 2023-08-21 ENCOUNTER — Encounter: Payer: Self-pay | Admitting: Cardiovascular Disease

## 2023-08-21 DIAGNOSIS — D2261 Melanocytic nevi of right upper limb, including shoulder: Secondary | ICD-10-CM | POA: Diagnosis not present

## 2023-08-21 DIAGNOSIS — Z85828 Personal history of other malignant neoplasm of skin: Secondary | ICD-10-CM | POA: Diagnosis not present

## 2023-08-21 DIAGNOSIS — D2272 Melanocytic nevi of left lower limb, including hip: Secondary | ICD-10-CM | POA: Diagnosis not present

## 2023-08-21 DIAGNOSIS — D2262 Melanocytic nevi of left upper limb, including shoulder: Secondary | ICD-10-CM | POA: Diagnosis not present

## 2023-08-21 DIAGNOSIS — L570 Actinic keratosis: Secondary | ICD-10-CM | POA: Diagnosis not present

## 2023-08-21 DIAGNOSIS — L590 Erythema ab igne [dermatitis ab igne]: Secondary | ICD-10-CM | POA: Diagnosis not present

## 2023-08-21 DIAGNOSIS — D2271 Melanocytic nevi of right lower limb, including hip: Secondary | ICD-10-CM | POA: Diagnosis not present

## 2023-08-27 ENCOUNTER — Ambulatory Visit
Admission: EM | Admit: 2023-08-27 | Discharge: 2023-08-27 | Disposition: A | Discharge status: Home / Self Care | Attending: Family Medicine | Admitting: Family Medicine

## 2023-08-27 DIAGNOSIS — J22 Unspecified acute lower respiratory infection: Secondary | ICD-10-CM

## 2023-08-27 MED ORDER — DOXYCYCLINE HYCLATE 100 MG PO CAPS: 100.0000 mg | ORAL_CAPSULE | Freq: Two times a day (BID) | ORAL | 0 refills | Status: DC

## 2023-08-27 MED ORDER — BENZONATATE 100 MG PO CAPS: 100.0000 mg | ORAL_CAPSULE | Freq: Three times a day (TID) | ORAL | 0 refills | Status: DC | PRN

## 2023-08-27 MED ORDER — ALBUTEROL SULFATE HFA 108 (90 BASE) MCG/ACT IN AERS: 1.0000 | INHALATION_SPRAY | Freq: Four times a day (QID) | RESPIRATORY_TRACT | 0 refills | Status: AC | PRN

## 2023-08-27 MED ORDER — CEFDINIR 300 MG PO CAPS
300.0000 mg | ORAL_CAPSULE | Freq: Two times a day (BID) | ORAL | 0 refills | Status: DC
Start: 2023-08-27 — End: 2023-09-04

## 2023-08-27 NOTE — Discharge Instructions (Addendum)
 Start doxycycline and cefdinir as prescribed.  Tessalon as needed for your cough.  Albuterol inhaler as needed for wheezing or shortness of breath.  Lots of rest and fluids.  Please follow-up with your PCP in 2 to 3 days for recheck.  Please go to the ER if you develop any worsening symptoms.  Hope you feel better soon!

## 2023-08-27 NOTE — ED Provider Notes (Addendum)
 Eric Bryant    CSN: [id number] Arrival date & time: 08/27/23  1841      History   Chief Complaint Chief Complaint  Eric Bryant presents with   Cough   Wheezing    HPI Eric Bryant is a 68 y.o. male  presents for evaluation of URI symptoms for 2 weeks. Eric Bryant reports associated symptoms of dry cough with wheezing and chest pain with coughing. Denies N/V/D, fevers, ear pain, sore throat, body aches, shortness of breath. Eric Bryant does not have a hx of asthma. Eric Bryant is not an active smoker.   Reports wife had similar symptoms.  Eric Bryant has taken cough medicine OTC for symptoms. Eric Bryant has no other concerns at this time.    Cough Associated symptoms: wheezing   Wheezing Associated symptoms: cough     Past Medical History:  Diagnosis Date   Chronic kidney disease    Coronary artery disease    Hypertension    Paroxysmal A-fib (HCC)    Stroke [facility])     Eric Bryant Active Problem List   Diagnosis Date Noted   CAD (coronary artery disease) 10/17/2021   Paroxysmal atrial fibrillation (HCC) 10/17/2021   Hyperlipidemia 10/17/2021   History of stroke 10/17/2021   Essential hypertension 10/17/2021   Chronic kidney disease 10/17/2021   Spinal stenosis of lumbar region with radiculopathy 05/30/2021    Past Surgical History:  Procedure Laterality Date   LEFT HEART CATH AND CORONARY ANGIOGRAPHY N/A 04/14/2020   Procedure: LEFT HEART CATH AND CORONARY ANGIOGRAPHY;  Surgeon: Wenona Hamilton, MD;  Location: [facility];  Service: Cardiovascular;  Laterality: N/A;   LUMBAR LAMINECTOMY/DECOMPRESSION MICRODISCECTOMY Right 05/30/2021   Procedure: Right, Lumbar four-five LAMINOTOMY, FORAMINOROMY;  Surgeon: Garry Kansas, MD;  Location: [facility] OR;  Service: Neurosurgery;  Laterality: Right;       Home Medications    Prior to Admission medications   Medication Sig Start Date End Date Taking? Authorizing Provider  albuterol (VENTOLIN HFA) 108 (90 Base) MCG/ACT inhaler Inhale 1-2  puffs into the lungs every 6 (six) hours as needed for wheezing or shortness of breath. 08/27/23  Yes Clotiel Troop, Jodi R, NP  benzonatate (TESSALON PERLES) 100 MG capsule Take 1 capsule (100 mg total) by mouth 3 (three) times daily as needed for cough. 08/27/23  Yes Morgane Joerger, Jodi R, NP  cefdinir (OMNICEF) 300 MG capsule Take 1 capsule (300 mg total) by mouth 2 (two) times daily. 08/27/23  Yes Anab Vivar, Jodi R, NP  doxycycline (VIBRAMYCIN) 100 MG capsule Take 1 capsule (100 mg total) by mouth 2 (two) times daily. 08/27/23  Yes Zonie Crutcher, Jodi R, NP  Red Yeast Rice 600 MG CAPS Take 1,200 capsules by mouth.   Yes [provider]  acetaminophen  (TYLENOL ) 500 MG tablet Take 500-1,000 mg by mouth every 6 (six) hours as needed (for pain). Eric Bryant not taking: Reported on 01/25/2022    [provider]  aspirin  EC 81 MG tablet Take 81 mg by mouth in the morning. Swallow whole.    [provider]  Coenzyme Q10-Vitamin E (QUNOL ULTRA COQ10 PO) Take 100 mg by mouth every evening.    [provider]  ELIQUIS  5 MG TABS tablet TAKE ONE TABLET EVERY 12 HOURS 05/14/23   Gollan, Timothy J, MD  HYDROcodone-acetaminophen  (NORCO/VICODIN) 5-325 MG tablet Take by mouth. Eric Bryant not taking: Reported on 10/30/2022 12/19/21   [provider]  Lidocaine -Glycerin (PREPARATION H EX) Place 1 application rectally as needed (hemmorroids/pain.). Eric Bryant not taking: Reported on 10/30/2022  [provider]  losartan (COZAAR) 25 MG tablet Take 25 mg by mouth daily. 10/14/21   [provider]  metoprolol  succinate (TOPROL -XL) 25 MG 24 hr tablet TAKE 1 TABLET BY MOUTH DAILY 04/16/23   Gollan, Timothy J, MD  Misc Natural Products (PROSTATE SUPPORT PO) Take 500 mg by mouth in the morning. Pygeum    [provider]  Omega-3 Fatty Acids (FISH OIL PO) Take 1,200 mg by mouth in the morning.    [provider]  rosuvastatin  (CRESTOR ) 20 MG tablet TAKE ONE TABLET EVERY DAY 07/27/23    Gollan, Timothy J, MD  Saw Palmetto 450 MG CAPS Take 450 mg by mouth in the morning.    [provider]  sotalol  (BETAPACE ) 160 MG tablet TAKE 1 TABLET BY MOUTH EVERY MORNING 07/27/23   Gollan, Deadra Everts, MD    Family History Family History  Problem Relation Age of Onset   Cancer Mother    Breast cancer Mother    Heart attack Father 90   Heart disease Father    Hyperlipidemia Brother     Social History Social History   Tobacco Use   Smoking status: Former    Types: Cigarettes   Smokeless tobacco: Former    Types: Associate Professor status: Never Used  Substance Use Topics   Alcohol use: Not Currently   Drug use: Never     Allergies   Penicillins   Review of Systems Review of Systems  Respiratory:  Positive for cough and wheezing.      Physical Exam Triage Vital Signs ED Triage Vitals  Encounter Vitals Group     BP 08/27/23 1905 (!) 143/80     Systolic BP Percentile --      Diastolic BP Percentile --      Pulse Rate 08/27/23 1905 72     Resp 08/27/23 1905 16     Temp 08/27/23 1905 97.7 F (36.5 C)     Temp Source 08/27/23 1905 Temporal     SpO2 08/27/23 1905 92 %     Weight --      Height --      Head Circumference --      Peak Flow --      Pain Score 08/27/23 1859 4     Pain Loc --      Pain Education --      Exclude from Growth Chart --    No data found.  Updated Vital Signs BP (!) 143/80 (BP Location: Left Arm)   Pulse 72   Temp 97.7 F (36.5 C) (Temporal)   Resp 16   SpO2 92%   Visual Acuity Right Eye Distance:   Left Eye Distance:   Bilateral Distance:    Right Eye Near:   Left Eye Near:    Bilateral Near:     Physical Exam Vitals and nursing note reviewed.  Constitutional:      General: He is not in acute distress.    Appearance: Normal appearance. He is not ill-appearing or toxic-appearing.  HENT:     Head: Normocephalic and atraumatic.     Right Ear: Tympanic membrane and ear canal normal.     Left Ear:  Tympanic membrane and ear canal normal.     Nose: No congestion.     Mouth/Throat:     Mouth: Mucous membranes are moist.     Pharynx: No posterior oropharyngeal erythema.  Eyes:     Pupils: Pupils are equal,  round, and reactive to light.  Cardiovascular:     Rate and Rhythm: Normal rate and regular rhythm.     Heart sounds: Normal heart sounds.  Pulmonary:     Effort: Pulmonary effort is normal.     Breath sounds: Normal breath sounds. No wheezing, rhonchi or rales.  Musculoskeletal:     Cervical back: Normal range of motion and neck supple.  Lymphadenopathy:     Cervical: No cervical adenopathy.  Skin:    General: Skin is warm and dry.  Neurological:     General: No focal deficit present.     Mental Status: He is alert and oriented to person, place, and time.  Psychiatric:        Mood and Affect: Mood normal.        Behavior: Behavior normal.      UC Treatments / Results  Labs (all labs ordered are listed, but only abnormal results are displayed) Labs Reviewed - No data to display  Comprehensive Metabolic Panel (CMP) Order: [id number] Component Ref Range & Units 4 mo ago  Glucose 70 - 110 mg/dL 88  Sodium 782 - 956 mmol/L 140  Potassium 3.6 - 5.1 mmol/L 4.6  Chloride 97 - 109 mmol/L 109  Carbon Dioxide (CO2) 22.0 - 32.0 mmol/L 27.6  Urea Nitrogen (BUN) 7 - 25 mg/dL 25  Creatinine 0.7 - 1.3 mg/dL 1.5 High   Glomerular Filtration Rate (eGFR) >60 mL/min/1.73sq m 50 Low   Comment: CKD-EPI (2021) does not include Eric Bryant's race in the calculation of eGFR.  Monitoring changes of plasma creatinine and eGFR over time is useful for monitoring kidney function.  Interpretive Ranges for eGFR (CKD-EPI 2021):  eGFR:       >60 mL/min/1.73 sq. m - Normal eGFR:       30-59 mL/min/1.73 sq. m - Moderately Decreased eGFR:       15-29 mL/min/1.73 sq. m  - Severely Decreased eGFR:       < 15 mL/min/1.73 sq. m  - Kidney Failure   Note: These eGFR calculations do not apply in  acute situations when eGFR is changing rapidly or patients on dialysis.  Calcium  8.7 - 10.3 mg/dL 11 High   AST 8 - 39 U/L 21  ALT 6 - 57 U/L 24  Alk Phos (alkaline Phosphatase) 34 - 104 U/L 85  Albumin 3.5 - 4.8 g/dL 4.1  Bilirubin, Total 0.3 - 1.2 mg/dL 0.8  Protein, Total 6.1 - 7.9 g/dL 6.4  A/G Ratio 1.0 - 5.0 gm/dL 1.8  Resulting Agency [facility] - [facility]  Narrative  This result has an attachment that is not available.   Specimen Collected: 04/13/23 10:35   Performed by: [facility] Last Resulted: 04/13/23 12:36    EKG   Radiology No results found.  Procedures Procedures (including critical care time)  Medications Ordered in UC Medications - No data to display  Initial Impression / Assessment and Plan / UC Course  I have reviewed the triage vital signs and the nursing notes.  Pertinent labs & imaging results that were available during my care of the Eric Bryant were reviewed by me and considered in my medical decision making (see chart for details).     Reviewed exam and symptoms with Eric Bryant.  No red flags.  discussed lower respiratory infection.  Albuterol inhaler as needed.  Tessalon as needed for cough.  Will start doxycycline and cefdinir.  Creatinine clearance 53.  No azithromycin due to sotalol .  Discussed rest  fluids and PCP follow-up 2 to 3 days for recheck.  ER precautions reviewed and Eric Bryant verbalized understanding. Final Clinical Impressions(s) / UC Diagnoses   Final diagnoses:  Lower respiratory infection (e.g., bronchitis, pneumonia, pneumonitis, pulmonitis)     Discharge Instructions      Start doxycycline and cefdinir as prescribed.  Tessalon as needed for your cough.  Albuterol inhaler as needed for wheezing or shortness of breath.  Lots of rest and fluids.  Please follow-up with your PCP in 2 to 3 days for recheck.  Please go to the ER if you develop any worsening symptoms.  Hope you feel better soon!    ED  Prescriptions     Medication Sig Dispense Auth. Provider   doxycycline (VIBRAMYCIN) 100 MG capsule Take 1 capsule (100 mg total) by mouth 2 (two) times daily. 20 capsule Bradin Mcadory, Jodi R, NP   benzonatate (TESSALON PERLES) 100 MG capsule Take 1 capsule (100 mg total) by mouth 3 (three) times daily as needed for cough. 20 capsule Xavia Kniskern, Jodi R, NP   cefdinir (OMNICEF) 300 MG capsule Take 1 capsule (300 mg total) by mouth 2 (two) times daily. 14 capsule Yaritzi Craun, Jodi R, NP   albuterol (VENTOLIN HFA) 108 (90 Base) MCG/ACT inhaler Inhale 1-2 puffs into the lungs every 6 (six) hours as needed for wheezing or shortness of breath. 1 each Alleen Arbour, NP      PDMP not reviewed this encounter.   Alleen Arbour, NP 08/27/23 1934    Alleen Arbour, NP 08/27/23 [place] Lias

## 2023-08-27 NOTE — ED Triage Notes (Signed)
 Patient presents to UC for cough, wheezing, and eye burning x 2 weeks. Coughing spells caused left sided rib pain and right sided back pain x last week. Has taking OTC cough meds.   Denies fever.

## 2023-09-03 NOTE — Progress Notes (Unsigned)
 Cardiology Office Note  Date:  09/04/2023   ID:  Eric Bryant, DOB 09-23-1955, MRN [id number]  PCP:  Monique Ano, MD   Chief Complaint  Patient presents with   12 month follow up     Patient c/o shortness of breath; just getting over bronchitis and mild pneumonia.     HPI:  Eric Bryant is a 68 year old gentleman with past medical history of Coronary artery disease, occluded RCA  in 03/2020 Paroxysmal atrial fibrillation Hypertension History of stroke Chronic kidney disease Who presents for routine follow-up of his paroxysmal atrial fibrillation, coronary disease, hypertension  Last seen in clinic by myself in 7/24 Getting over bronchitis, "very bad" Had significant coughing, chest congestion, has turned the corner and starting to feel better  In atrial flutter on today's visit, only mildly symptomatic Reports compliance with his Eliquis , metoprolol , sotalol  Continues to take sotalol  in the evening once a day, metoprolol  in the morning  Denies significant PND orthopnea no leg swelling 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 Marven Slimmer July 2023 Severe sleep apnea, recommendation for CPAP  Lab work reviewed Total cholesterol 144 LDL 77 Potassium 4.6 CR 1.5  EKG personally reviewed by myself on todays visit EKG Interpretation Date/Time:  Tuesday Sep 04 2023 10:42:45 EDT Ventricular Rate:  96 PR Interval:    QRS Duration:  94 QT Interval:  312 QTC Calculation: 394 R Axis:   17  Text Interpretation: Atrial flutter with variable A-V block When compared with ECG of 30-Oct-2022 09:01, Atrial flutter has replaced Sinus rhythm Vent. rate has increased BY  39 BPM Confirmed by Belva Boyden [phone]) on 09/04/2023 10:48:48 AM   Other cardiac work-up reviewed left heart catheterization with Dr. Alvenia Aus on 04/14/20 revealed severe one-vessel coronary artery disease with an occluded proximal PRCS with left to right collaterals; 60% proximal left  circumflex stenosis and diffuse 30-40% proximal/mid LAD stenosis. Medical management recommended.  He reports having 2-3 episodes of rapid heart rate a month, very symptomatic Only taking sotolol once a day, even previously tried 1/2 pill/month Does not like taking medications in general, reports compliance with his Eliquis  Tachycardia episodes lasting 1 hr to 3-4 hrs  Very busy, works at [employer] work reviewed creatinine 1.5, followed by primary care Recent change off amlodipine   on to losartan  EKG personally reviewed by myself on todays visit Normal sinus rhythm with rate 52 bpm no significant ST-T wave changes   PMH:   has a past medical history of Chronic kidney disease, Coronary artery disease, Hypertension, Paroxysmal A-fib (HCC), and Stroke (HCC).  PSH:    Past Surgical History:  Procedure Laterality Date   LEFT HEART CATH AND CORONARY ANGIOGRAPHY N/A 04/14/2020   Procedure: LEFT HEART CATH AND CORONARY ANGIOGRAPHY;  Surgeon: Wenona Hamilton, MD;  Location: [facility];  Service: Cardiovascular;  Laterality: N/A;   LUMBAR LAMINECTOMY/DECOMPRESSION MICRODISCECTOMY Right 05/30/2021   Procedure: Right, Lumbar four-five LAMINOTOMY, FORAMINOROMY;  Surgeon: Garry Kansas, MD;  Location: [facility] OR;  Service: Neurosurgery;  Laterality: Right;    Current Outpatient Medications  Medication Sig Dispense Refill   aspirin  EC 81 MG tablet Take 81 mg by mouth in the morning. Swallow whole.     Coenzyme Q10-Vitamin E (QUNOL ULTRA COQ10 PO) Take 100 mg by mouth every evening.     ELIQUIS  5 MG TABS tablet TAKE ONE TABLET EVERY 12 HOURS 180 tablet 1   losartan (COZAAR) 25 MG tablet Take 25 mg  by mouth daily.     metoprolol  succinate (TOPROL -XL) 25 MG 24 hr tablet TAKE 1 TABLET BY MOUTH DAILY 90 tablet 2   Misc Natural Products (PROSTATE SUPPORT PO) Take 500 mg by mouth in the morning. Pygeum     Omega-3 Fatty Acids (FISH OIL PO) Take 1,200 mg by mouth in the morning.     Red  Yeast Rice 600 MG CAPS Take 1,200 capsules by mouth.     rosuvastatin  (CRESTOR ) 20 MG tablet TAKE ONE TABLET EVERY DAY 30 tablet 5   Saw Palmetto 450 MG CAPS Take 450 mg by mouth in the morning.     sotalol  (BETAPACE ) 160 MG tablet TAKE 1 TABLET BY MOUTH EVERY MORNING 90 tablet 10   acetaminophen  (TYLENOL ) 500 MG tablet Take 500-1,000 mg by mouth every 6 (six) hours as needed (for pain). (Patient not taking: Reported on 01/25/2022)     albuterol  (VENTOLIN  HFA) 108 (90 Base) MCG/ACT inhaler Inhale 1-2 puffs into the lungs every 6 (six) hours as needed for wheezing or shortness of breath. (Patient not taking: Reported on 09/04/2023) 1 each 0   HYDROcodone-acetaminophen  (NORCO/VICODIN) 5-325 MG tablet Take by mouth. (Patient not taking: Reported on 09/04/2023)     Lidocaine -Glycerin (PREPARATION H EX) Place 1 application rectally as needed (hemmorroids/pain.). (Patient not taking: Reported on 10/30/2022)     No current facility-administered medications for this visit.     Allergies:   Penicillins   Social History:  The patient  reports that he has quit smoking. His smoking use included cigarettes. He has quit using smokeless tobacco.  His smokeless tobacco use included chew. He reports that he does not currently use alcohol. He reports that he does not use drugs.   Family History:   family history includes Breast cancer in his mother; Cancer in his mother; Heart attack (age of onset: 57) in his father; Heart disease in his father; Hyperlipidemia in his brother.    Review of Systems: Review of Systems  Constitutional: Negative.   HENT: Negative.    Respiratory: Negative.    Cardiovascular:  Positive for palpitations.  Gastrointestinal: Negative.   Musculoskeletal: Negative.   Neurological: Negative.   Psychiatric/Behavioral: Negative.    All other systems reviewed and are negative.   PHYSICAL EXAM: VS:  BP 130/80 (BP Location: Left Arm, Patient Position: Sitting, Cuff Size: Normal)   Pulse 96    Ht 5\' 8"  (1.727 m)   Wt 166 lb (75.3 kg)   SpO2 98%   BMI 25.24 kg/m  , BMI Body mass index is 25.24 kg/m. Constitutional:  oriented to person, place, and time. No distress.  HENT:  Head: Grossly normal Eyes:  no discharge. No scleral icterus.  Neck: No JVD, no carotid bruits  Cardiovascular: Regular rate and rhythm, no murmurs appreciated Pulmonary/Chest: Clear to auscultation bilaterally, no wheezes or rales Abdominal: Soft.  no distension.  no tenderness.  Musculoskeletal: Normal range of motion Neurological:  normal muscle tone. Coordination normal. No atrophy Skin: Skin warm and dry Psychiatric: normal affect, pleasant   Recent Labs: No results found for requested labs within last 365 days.    Lipid Panel No results found for: "CHOL", "HDL", "LDLCALC", "TRIG"    Wt Readings from Last 3 Encounters:  09/04/23 166 lb (75.3 kg)  10/30/22 178 lb 8 oz (81 kg)  01/25/22 165 lb 8 oz (75.1 kg)     ASSESSMENT AND PLAN:  Problem List Items Addressed This Visit       Cardiology  Problems   CAD (coronary artery disease) - Primary   Relevant Orders   EKG 12-Lead (Completed)   Paroxysmal atrial fibrillation (HCC)   Relevant Orders   EKG 12-Lead (Completed)   Hyperlipidemia   Essential hypertension   Relevant Orders   EKG 12-Lead (Completed)     Other   History of stroke   Chronic kidney disease   Other Visit Diagnoses       OSA (obstructive sleep apnea)         Snoring          Atrial fibrillation, paroxysmal Has been evaluated by EP Noted to be in atrial flutter today after recent severe bronchitis, now back to his baseline Diagnosis of sleep apnea, severe on CPAP  sotalol  at dinnertime, continue metoprolol  succinate in the morning Discussed various treatment options for atrial flutter, recommended cardioversion, he will call when he is ready to have this scheduled, needs to find transportation  Coronary disease with stable angina Denies chest pain  concerning for angina  Prior catheterization detailing residual disease 60% circumflex 40% LAD in 2021 Continue Crestor  20 daily  Hyperlipidemia LDL slightly above goal 77, previously declined  Zetia Continue Crestor   Essential hypertension Blood pressure is well controlled on today's visit. No changes made to the medications.    Signed, Juanda Noon, M.D., Ph.D. [facility] [place], [place] [phone]

## 2023-09-04 ENCOUNTER — Ambulatory Visit: Payer: Self-pay | Attending: Cardiovascular Disease | Admitting: Cardiovascular Disease

## 2023-09-04 ENCOUNTER — Encounter: Payer: Self-pay | Admitting: Cardiovascular Disease

## 2023-09-04 VITALS — BP 130/80 | HR 96 | Ht 68.0 in | Wt 166.0 lb

## 2023-09-04 DIAGNOSIS — E782 Mixed hyperlipidemia: Secondary | ICD-10-CM

## 2023-09-04 DIAGNOSIS — Z8673 Personal history of transient ischemic attack (TIA), and cerebral infarction without residual deficits: Secondary | ICD-10-CM

## 2023-09-04 DIAGNOSIS — I10 Essential (primary) hypertension: Secondary | ICD-10-CM

## 2023-09-04 DIAGNOSIS — G4733 Obstructive sleep apnea (adult) (pediatric): Secondary | ICD-10-CM

## 2023-09-04 DIAGNOSIS — Z79899 Other long term (current) drug therapy: Secondary | ICD-10-CM | POA: Diagnosis not present

## 2023-09-04 DIAGNOSIS — I480 Paroxysmal atrial fibrillation: Secondary | ICD-10-CM

## 2023-09-04 DIAGNOSIS — I25118 Atherosclerotic heart disease of native coronary artery with other forms of angina pectoris: Secondary | ICD-10-CM | POA: Diagnosis not present

## 2023-09-04 DIAGNOSIS — N189 Chronic kidney disease, unspecified: Secondary | ICD-10-CM

## 2023-09-04 DIAGNOSIS — R0683 Snoring: Secondary | ICD-10-CM | POA: Diagnosis not present

## 2023-09-04 NOTE — Patient Instructions (Addendum)
 Please call when you would like a cardioversion for atrial flutter   Medication Instructions:  No changes  If you need a refill on your cardiac medications before your next appointment, please call your pharmacy.   Lab work: Your provider would like for you to have following labs drawn today CBC and BMP.    Testing/Procedures:    Dear Eric Bryant  Please call when you are ready to  schedule a Cardioversion  with Dr. Gollan.  Please arrive at the [facility] Entrance of [facility], [address], [place] [address] at  (This is 1 hour(s) prior to your procedure time).  Proceed to the Check-In Desk directly inside the entrance.  Procedure Parking: Use the entrance off of the [address] side of the hospital. Turn right upon entering and follow the driveway to parking that is directly in front of the Heart & Vascular Center. There is no valet parking available at this entrance, however there is an awning directly in front of the Heart & Vascular Center for drop off/ pick up for patients.   DIET:  Nothing to eat or drink after midnight except a sip of water with medications (see medication instructions below)  MEDICATION INSTRUCTIONS: !!IF ANY NEW MEDICATIONS ARE STARTED AFTER TODAY, PLEASE NOTIFY YOUR PROVIDER AS SOON AS POSSIBLE!!  FYI: Medications such as Semaglutide (Ozempic, [place]), Tirzepatide (Mounjaro, Zepbound), Dulaglutide (Trulicity), etc ("GLP1 agonists") AND Canagliflozin (Invokana), Dapagliflozin (Farxiga), Empagliflozin (Jardiance), Ertugliflozin (Steglatro), Bexagliflozin [employer]) or any combination with one of these drugs such as Invokamet (Canagliflozin/Metformin), Synjardy (Empagliflozin/Metformin), etc ("SGLT2 inhibitors") must be held around the time of a procedure. This is not a comprehensive list of all of these drugs. Please review all of your medications and talk to your provider if you take any one of these. If you are not sure, ask your provider.          Continue taking your anticoagulant (blood thinner): Apixaban  (Eliquis ).  You will need to continue this after your procedure until you are told by your provider that it is safe to stop.    FYI:  For your safety, and to allow us  to monitor your vital signs accurately during the surgery/procedure we request: If you have artificial nails, gel coating, SNS etc, please have those removed prior to your surgery/procedure. Not having the nail coverings /polish removed may result in cancellation or delay of your surgery/procedure.  You must have a responsible person to drive you home and stay in the waiting area during your procedure. Failure to do so could result in cancellation.  Bring your insurance cards.  *Special Note: Every effort is made to have your procedure done on time. Occasionally there are emergencies that occur at the hospital that may cause delays. Please be patient if a delay does occur.      Follow-Up: At [facility], you and your health needs are our priority.  As part of our continuing mission to provide you with exceptional heart care, we have created designated Provider Care Teams.  These Care Teams include your primary Cardiologist (physician) and Advanced Practice Providers (APPs -  Physician Assistants and Nurse Practitioners) who all work together to provide you with the care you need, when you need it.  You will need a follow up appointment in 1 month after cardioversion  Providers on your designated Care Team:   Laneta Pintos, NP Varney Gentleman, PA-C Cadence Gennaro Khat, PA-C  COVID-19 Vaccine Information can be found at: [email or web address] For questions  related to vaccine distribution or appointments, please email [email or web address] .[email or web address] or call [phone].

## 2023-09-05 LAB — BASIC METABOLIC PANEL WITH GFR
BUN/Creatinine Ratio: 20 (ref 10–24)
BUN: 33 mg/dL — ABNORMAL HIGH (ref 8–27)
CO2: 20 mmol/L (ref 20–29)
Calcium: 11.5 mg/dL — ABNORMAL HIGH (ref 8.6–10.2)
Chloride: 108 mmol/L — ABNORMAL HIGH (ref 96–106)
Creatinine, Ser: 1.68 mg/dL — ABNORMAL HIGH (ref 0.76–1.27)
Glucose: 91 mg/dL (ref 70–99)
Potassium: 5.4 mmol/L — ABNORMAL HIGH (ref 3.5–5.2)
Sodium: 141 mmol/L (ref 134–144)
eGFR: 44 mL/min/{1.73_m2} — ABNORMAL LOW (ref >59)

## 2023-09-05 LAB — CBC
Hematocrit: 43.7 % (ref 37.5–51.0)
Hemoglobin: 14.1 g/dL (ref 13.0–17.7)
MCH: 31.7 pg (ref 26.6–33.0)
MCHC: 32.3 g/dL (ref 31.5–35.7)
MCV: 98 fL — ABNORMAL HIGH (ref 79–97)
Platelets: 354 10*3/uL (ref 150–450)
RBC: 4.45 x10E6/uL (ref 4.14–5.80)
RDW: 12.9 % (ref 11.6–15.4)
WBC: 7.2 10*3/uL (ref 3.4–10.8)

## 2023-09-09 ENCOUNTER — Encounter: Payer: Self-pay | Admitting: Cardiovascular Disease

## 2023-09-10 ENCOUNTER — Encounter: Payer: Self-pay | Admitting: Emergency Medicine

## 2023-10-23 DIAGNOSIS — I25118 Atherosclerotic heart disease of native coronary artery with other forms of angina pectoris: Secondary | ICD-10-CM | POA: Diagnosis not present

## 2023-10-23 DIAGNOSIS — N1831 Chronic kidney disease, stage 3a: Secondary | ICD-10-CM | POA: Diagnosis not present

## 2023-10-23 DIAGNOSIS — I480 Paroxysmal atrial fibrillation: Secondary | ICD-10-CM | POA: Diagnosis not present

## 2023-10-23 DIAGNOSIS — I10 Essential (primary) hypertension: Secondary | ICD-10-CM | POA: Diagnosis not present

## 2023-10-24 DIAGNOSIS — N1831 Chronic kidney disease, stage 3a: Secondary | ICD-10-CM | POA: Diagnosis not present

## 2023-10-24 DIAGNOSIS — Z0000 Encounter for general adult medical examination without abnormal findings: Secondary | ICD-10-CM | POA: Diagnosis not present

## 2023-10-24 DIAGNOSIS — Z8673 Personal history of transient ischemic attack (TIA), and cerebral infarction without residual deficits: Secondary | ICD-10-CM | POA: Diagnosis not present

## 2023-10-24 DIAGNOSIS — I10 Essential (primary) hypertension: Secondary | ICD-10-CM | POA: Diagnosis not present

## 2023-10-24 DIAGNOSIS — Z1331 Encounter for screening for depression: Secondary | ICD-10-CM | POA: Diagnosis not present

## 2023-10-24 DIAGNOSIS — I25118 Atherosclerotic heart disease of native coronary artery with other forms of angina pectoris: Secondary | ICD-10-CM | POA: Diagnosis not present

## 2023-11-08 ENCOUNTER — Other Ambulatory Visit: Payer: Self-pay | Admitting: Cardiovascular Disease

## 2023-11-08 DIAGNOSIS — I480 Paroxysmal atrial fibrillation: Secondary | ICD-10-CM

## 2024-01-25 ENCOUNTER — Other Ambulatory Visit: Payer: Self-pay | Admitting: Cardiovascular Disease

## 2024-02-07 ENCOUNTER — Ambulatory Visit
Admission: EM | Admit: 2024-02-07 | Discharge: 2024-02-07 | Disposition: A | Discharge status: Home / Self Care | Attending: Emergency Medicine | Admitting: Emergency Medicine

## 2024-02-07 DIAGNOSIS — J069 Acute upper respiratory infection, unspecified: Secondary | ICD-10-CM | POA: Diagnosis not present

## 2024-02-07 DIAGNOSIS — J0100 Acute maxillary sinusitis, unspecified: Secondary | ICD-10-CM | POA: Diagnosis not present

## 2024-02-07 MED ORDER — CEFDINIR 300 MG PO CAPS
300.0000 mg | ORAL_CAPSULE | Freq: Two times a day (BID) | ORAL | 0 refills | Status: AC
Start: 2024-02-07 — End: 2024-02-14

## 2024-02-07 NOTE — Discharge Instructions (Addendum)
 Take the cefdinir  as directed.  Follow up with your primary care provider.  Go to the emergency department if you have worsening symptoms.

## 2024-02-07 NOTE — ED Triage Notes (Signed)
 Patient to Urgent Care with complaints of sore throat/ runny nose/ headaches/ shortness of breath/ chest congestion/ productive cough. Reports when he takes a deep breath he started coughing.   Symptoms x4 days.  Meds: nyquil and dayquil.

## 2024-02-07 NOTE — ED Provider Notes (Signed)
 Eric Bryant    CSN: [id number] Arrival date & time: 02/07/24  1844      History   Chief Complaint Chief Complaint  Patient presents with   Nasal Congestion   Headache   Shortness of Breath    HPI Eric Bryant is a 68 y.o. male.  Accompanied by his wife, patient presents with 4-5 day history of runny nose, congestion, sore throat, cough, headache.  No fever, chest pain, shortness of breath.  He has been treating his symptoms with DayQuil and NyQuil without relief.  His medical history includes atrial fibrillation, hypertension, stroke, CKD.  The history is provided by the patient, the spouse and medical records.    Past Medical History:  Diagnosis Date   Chronic kidney disease    Coronary artery disease    Hypertension    Paroxysmal A-fib (HCC)    Stroke [facility])     Patient Active Problem List   Diagnosis Date Noted   CAD (coronary artery disease) 10/17/2021   Paroxysmal atrial fibrillation (HCC) 10/17/2021   Hyperlipidemia 10/17/2021   History of stroke 10/17/2021   Essential hypertension 10/17/2021   Chronic kidney disease 10/17/2021   Spinal stenosis of lumbar region with radiculopathy 05/30/2021    Past Surgical History:  Procedure Laterality Date   LEFT HEART CATH AND CORONARY ANGIOGRAPHY N/A 04/14/2020   Procedure: LEFT HEART CATH AND CORONARY ANGIOGRAPHY;  Surgeon: Darron Deatrice LABOR, MD;  Location: [facility];  Service: Cardiovascular;  Laterality: N/A;   LUMBAR LAMINECTOMY/DECOMPRESSION MICRODISCECTOMY Right 05/30/2021   Procedure: Right, Lumbar four-five LAMINOTOMY, FORAMINOROMY;  Surgeon: Mavis Purchase, MD;  Location: [facility] OR;  Service: Neurosurgery;  Laterality: Right;       Home Medications    Prior to Admission medications   Medication Sig Start Date End Date Taking? Authorizing Provider  cefdinir  (OMNICEF ) 300 MG capsule Take 1 capsule (300 mg total) by mouth 2 (two) times daily for 7 days. 02/07/24 02/14/24 Yes Corlis Burnard DEL,  NP  acetaminophen  (TYLENOL ) 500 MG tablet Take 500-1,000 mg by mouth every 6 (six) hours as needed (for pain). Patient not taking: Reported on 01/25/2022    [provider]  albuterol  (VENTOLIN  HFA) 108 (90 Base) MCG/ACT inhaler Inhale 1-2 puffs into the lungs every 6 (six) hours as needed for wheezing or shortness of breath. Patient not taking: Reported on 09/04/2023 08/27/23   Loreda Myla SAUNDERS, NP  aspirin  EC 81 MG tablet Take 81 mg by mouth in the morning. Swallow whole.    [provider]  Coenzyme Q10-Vitamin E (QUNOL ULTRA COQ10 PO) Take 100 mg by mouth every evening.    [provider]  ELIQUIS  5 MG TABS tablet TAKE 1 TABLET BY MOUTH EVERY 12 HOURS 11/13/23   Gollan, Timothy J, MD  HYDROcodone-acetaminophen  (NORCO/VICODIN) 5-325 MG tablet Take by mouth. Patient not taking: Reported on 09/04/2023 12/19/21   [provider]  Lidocaine -Glycerin (PREPARATION H EX) Place 1 application rectally as needed (hemmorroids/pain.). Patient not taking: Reported on 10/30/2022    [provider]  losartan (COZAAR) 25 MG tablet Take 25 mg by mouth daily. 10/14/21   [provider]  metoprolol  succinate (TOPROL -XL) 25 MG 24 hr tablet TAKE 1 TABLET BY MOUTH DAILY 01/25/24   Gollan, Timothy J, MD  Misc Natural Products (PROSTATE SUPPORT PO) Take 500 mg by mouth in the morning. Pygeum    [provider]  Omega-3 Fatty Acids (FISH OIL PO) Take 1,200 mg by mouth in the morning.  [provider]  Red Yeast Rice 600 MG CAPS Take 1,200 capsules by mouth.    [provider]  rosuvastatin  (CRESTOR ) 20 MG tablet TAKE ONE TABLET ONCE DAILY 01/25/24   Gollan, Timothy J, MD  Saw Palmetto 450 MG CAPS Take 450 mg by mouth in the morning.    [provider]  sotalol  (BETAPACE ) 160 MG tablet TAKE 1 TABLET BY MOUTH EVERY MORNING 07/27/23   Gollan, Evalene PARAS, MD    Family History Family History  Problem Relation Age of Onset   Cancer Mother     Breast cancer Mother    Heart attack Father 11   Heart disease Father    Hyperlipidemia Brother     Social History Social History   Tobacco Use   Smoking status: Former    Types: Cigarettes   Smokeless tobacco: Former    Types: Associate Professor status: Never Used  Substance Use Topics   Alcohol use: Not Currently   Drug use: Never     Allergies   Penicillins   Review of Systems Review of Systems  Constitutional:  Negative for chills and fever.  HENT:  Positive for congestion, postnasal drip, sinus pressure and sore throat. Negative for ear pain.   Respiratory:  Positive for cough. Negative for shortness of breath.   Cardiovascular:  Negative for chest pain and palpitations.  Neurological:  Positive for headaches.     Physical Exam Triage Vital Signs ED Triage Vitals  Encounter Vitals Group     BP 02/07/24 1903 139/88     Girls Systolic BP Percentile --      Girls Diastolic BP Percentile --      Boys Systolic BP Percentile --      Boys Diastolic BP Percentile --      Pulse Rate 02/07/24 1903 65     Resp 02/07/24 1903 18     Temp 02/07/24 1903 98.5 F (36.9 C)     Temp src --      SpO2 02/07/24 1903 98 %     Weight --      Height --      Head Circumference --      Peak Flow --      Pain Score 02/07/24 1858 2     Pain Loc --      Pain Education --      Exclude from Growth Chart --    No data found.  Updated Vital Signs BP 139/88   Pulse 65   Temp 98.5 F (36.9 C)   Resp 18   SpO2 98%   Visual Acuity Right Eye Distance:   Left Eye Distance:   Bilateral Distance:    Right Eye Near:   Left Eye Near:    Bilateral Near:     Physical Exam Constitutional:      General: He is not in acute distress. HENT:     Right Ear: Tympanic membrane normal.     Left Ear: Tympanic membrane normal.     Nose: Congestion and rhinorrhea present.     Mouth/Throat:     Mouth: Mucous membranes are moist.     Pharynx: Oropharynx is clear.   Cardiovascular:     Rate and Rhythm: Normal rate and regular rhythm.     Heart sounds: Normal heart sounds.  Pulmonary:     Effort: Pulmonary effort is normal. No respiratory distress.     Breath sounds: Normal breath sounds.  Neurological:  Mental Status: He is alert.      UC Treatments / Results  Labs (all labs ordered are listed, but only abnormal results are displayed) Labs Reviewed - No data to display  EKG   Radiology No results found.  Procedures Procedures (including critical care time)  Medications Ordered in UC Medications - No data to display  Initial Impression / Assessment and Plan / UC Course  I have reviewed the triage vital signs and the nursing notes.  Pertinent labs & imaging results that were available during my care of the patient were reviewed by me and considered in my medical decision making (see chart for details).    Acute sinusitis, acute respiratory infection.  Afebrile and vital signs are stable.  Lungs are clear at this time and O2 sat is 98% on room air.  Patient declines COVID test.  He has been symptomatic for 4-5 days and is not improving with OTC medication.  Treating today with cefdinir  which patient has taken in the past without difficulty.  Instructed patient to follow-up with his PCP.  ED precautions given.  Education provided on acute sinus infection and URI.  Patient agrees to plan of care.  Final Clinical Impressions(s) / UC Diagnoses   Final diagnoses:  Acute non-recurrent maxillary sinusitis  Acute upper respiratory infection     Discharge Instructions      Take the cefdinir  as directed.  Follow up with your primary care provider.  Go to the emergency department if you have worsening symptoms.        ED Prescriptions     Medication Sig Dispense Auth. Provider   cefdinir  (OMNICEF ) 300 MG capsule Take 1 capsule (300 mg total) by mouth 2 (two) times daily for 7 days. 14 capsule Corlis Burnard DEL, NP      PDMP not  reviewed this encounter.   Corlis Burnard DEL, NP 02/07/24 1932

## 2024-05-06 DIAGNOSIS — I480 Paroxysmal atrial fibrillation: Secondary | ICD-10-CM

## 2024-05-06 NOTE — Telephone Encounter (Signed)
 Prescription refill request for Eliquis  received. Indication:afib Last office visit:5/25 Scr: 1.5  12/25 Age:69 Weight:75.3  kg  Prescription refilled
